# Patient Record
Sex: Male | Born: 1985 | Race: Black or African American | Hispanic: No | Marital: Married | Smoking: Current every day smoker
Health system: Southern US, Community
[De-identification: ages and names within clinical notes are randomized; demographics above are authoritative.]

## PROBLEM LIST (undated history)

## (undated) DIAGNOSIS — J45909 Unspecified asthma, uncomplicated: Secondary | ICD-10-CM

## (undated) DIAGNOSIS — G4730 Sleep apnea, unspecified: Secondary | ICD-10-CM

---

## 2013-01-28 ENCOUNTER — Encounter (HOSPITAL_COMMUNITY): Payer: Self-pay | Admitting: Emergency Medicine

## 2013-01-28 ENCOUNTER — Emergency Department (HOSPITAL_COMMUNITY): Payer: Self-pay

## 2013-01-28 ENCOUNTER — Observation Stay (HOSPITAL_COMMUNITY)
Admission: EM | Admit: 2013-01-28 | Discharge: 2013-01-29 | Disposition: A | Discharge status: Home / Self Care | Payer: MEDICAID | Attending: Internal Medicine | Admitting: Internal Medicine

## 2013-01-28 ENCOUNTER — Observation Stay (HOSPITAL_COMMUNITY): Payer: Self-pay

## 2013-01-28 DIAGNOSIS — E860 Dehydration: Secondary | ICD-10-CM

## 2013-01-28 DIAGNOSIS — G4733 Obstructive sleep apnea (adult) (pediatric): Secondary | ICD-10-CM

## 2013-01-28 DIAGNOSIS — F1210 Cannabis abuse, uncomplicated: Secondary | ICD-10-CM | POA: Insufficient documentation

## 2013-01-28 DIAGNOSIS — N289 Disorder of kidney and ureter, unspecified: Secondary | ICD-10-CM | POA: Diagnosis present

## 2013-01-28 DIAGNOSIS — N179 Acute kidney failure, unspecified: Secondary | ICD-10-CM | POA: Insufficient documentation

## 2013-01-28 DIAGNOSIS — Z720 Tobacco use: Secondary | ICD-10-CM | POA: Diagnosis present

## 2013-01-28 DIAGNOSIS — R55 Syncope and collapse: Secondary | ICD-10-CM

## 2013-01-28 DIAGNOSIS — R61 Generalized hyperhidrosis: Secondary | ICD-10-CM | POA: Insufficient documentation

## 2013-01-28 DIAGNOSIS — J45909 Unspecified asthma, uncomplicated: Secondary | ICD-10-CM | POA: Insufficient documentation

## 2013-01-28 DIAGNOSIS — R5381 Other malaise: Secondary | ICD-10-CM | POA: Insufficient documentation

## 2013-01-28 DIAGNOSIS — D649 Anemia, unspecified: Secondary | ICD-10-CM | POA: Insufficient documentation

## 2013-01-28 DIAGNOSIS — Z9989 Dependence on other enabling machines and devices: Secondary | ICD-10-CM | POA: Diagnosis present

## 2013-01-28 DIAGNOSIS — F1010 Alcohol abuse, uncomplicated: Secondary | ICD-10-CM | POA: Insufficient documentation

## 2013-01-28 DIAGNOSIS — Z87891 Personal history of nicotine dependence: Secondary | ICD-10-CM | POA: Insufficient documentation

## 2013-01-28 DIAGNOSIS — R3989 Other symptoms and signs involving the genitourinary system: Secondary | ICD-10-CM | POA: Insufficient documentation

## 2013-01-28 DIAGNOSIS — R0989 Other specified symptoms and signs involving the circulatory and respiratory systems: Secondary | ICD-10-CM | POA: Insufficient documentation

## 2013-01-28 DIAGNOSIS — R5383 Other fatigue: Secondary | ICD-10-CM | POA: Insufficient documentation

## 2013-01-28 DIAGNOSIS — M6282 Rhabdomyolysis: Secondary | ICD-10-CM | POA: Insufficient documentation

## 2013-01-28 HISTORY — DX: Unspecified asthma, uncomplicated: J45.909

## 2013-01-28 HISTORY — DX: Sleep apnea, unspecified: G47.30

## 2013-01-28 LAB — CBC
HCT: 40.9 % (ref 39.0–52.0)
MCH: 27.4 pg (ref 26.0–34.0)
MCV: 81.2 fL (ref 78.0–100.0)
RBC: 5.04 MIL/uL (ref 4.22–5.81)
WBC: 7.5 10*3/uL (ref 4.0–10.5)

## 2013-01-28 LAB — COMPREHENSIVE METABOLIC PANEL
Albumin: 3.7 g/dL (ref 3.5–5.2)
Alkaline Phosphatase: 40 U/L (ref 39–117)
BUN: 11 mg/dL (ref 6–23)
CO2: 24 mEq/L (ref 19–32)
Chloride: 105 mEq/L (ref 96–112)
Creatinine, Ser: 1.57 mg/dL — ABNORMAL HIGH (ref 0.50–1.35)
GFR calc Af Amer: 67 mL/min — ABNORMAL LOW (ref >90)
GFR calc non Af Amer: 58 mL/min — ABNORMAL LOW (ref >90)
Glucose, Bld: 135 mg/dL — ABNORMAL HIGH (ref 70–99)
Potassium: 3.5 mEq/L (ref 3.5–5.1)
Total Bilirubin: 0.2 mg/dL — ABNORMAL LOW (ref 0.3–1.2)

## 2013-01-28 LAB — BASIC METABOLIC PANEL
CO2: 22 mEq/L (ref 19–32)
Calcium: 8.8 mg/dL (ref 8.4–10.5)
Chloride: 105 mEq/L (ref 96–112)
Creatinine, Ser: 1.16 mg/dL (ref 0.50–1.35)
Glucose, Bld: 116 mg/dL — ABNORMAL HIGH (ref 70–99)

## 2013-01-28 LAB — CK TOTAL AND CKMB (NOT AT ARMC)
CK, MB: 4.8 ng/mL — ABNORMAL HIGH (ref 0.3–4.0)
Total CK: 817 U/L — ABNORMAL HIGH (ref 7–232)

## 2013-01-28 LAB — RAPID URINE DRUG SCREEN, HOSP PERFORMED
Amphetamines: NOT DETECTED
Opiates: NOT DETECTED

## 2013-01-28 LAB — URINALYSIS, ROUTINE W REFLEX MICROSCOPIC
Glucose, UA: NEGATIVE mg/dL
Ketones, ur: NEGATIVE mg/dL
Leukocytes, UA: NEGATIVE
Protein, ur: NEGATIVE mg/dL
Urobilinogen, UA: 0.2 mg/dL (ref 0.0–1.0)

## 2013-01-28 LAB — URINE MICROSCOPIC-ADD ON

## 2013-01-28 LAB — POCT I-STAT, CHEM 8
Chloride: 106 mEq/L (ref 96–112)
Creatinine, Ser: 1.6 mg/dL — ABNORMAL HIGH (ref 0.50–1.35)
HCT: 45 % (ref 39.0–52.0)
Hemoglobin: 15.3 g/dL (ref 13.0–17.0)
Potassium: 3.3 mEq/L — ABNORMAL LOW (ref 3.5–5.1)
Sodium: 144 mEq/L (ref 135–145)

## 2013-01-28 LAB — POCT I-STAT TROPONIN I

## 2013-01-28 MED ORDER — SODIUM CHLORIDE 0.9 % IV SOLN
INTRAVENOUS | Status: DC
  Administered 2013-01-28 – 2013-01-29 (×2): via INTRAVENOUS

## 2013-01-28 MED ORDER — POTASSIUM CHLORIDE CRYS ER 20 MEQ PO TBCR
40.0000 meq | EXTENDED_RELEASE_TABLET | Freq: Once | ORAL | Status: AC
  Administered 2013-01-28: 40 meq via ORAL
  Filled 2013-01-28: qty 2

## 2013-01-28 MED ORDER — SODIUM CHLORIDE 0.9 % IV SOLN
INTRAVENOUS | Status: AC
  Administered 2013-01-28: 150 mL/h via INTRAVENOUS

## 2013-01-28 MED ORDER — SODIUM CHLORIDE 0.9 % IV BOLUS (SEPSIS)
1000.0000 mL | Freq: Once | INTRAVENOUS | Status: AC
  Administered 2013-01-28: 1000 mL via INTRAVENOUS

## 2013-01-28 NOTE — ED Notes (Signed)
Pt admits to smoking marijuana tonight

## 2013-01-28 NOTE — ED Provider Notes (Signed)
History    CSN: [id number] Arrival date & time 01/28/13  0302  First MD Initiated Contact with Patient 01/28/13 [phone]     Chief Complaint  Patient presents with  . Loss of Consciousness  . Alcohol Intoxication   (Consider location/radiation/quality/duration/timing/severity/associated sxs/prior Treatment) HPI History provided by the patient.  Last ate around noon, spent the day outside in the heat.  This evening went out with friends, consumed 3-4 alcoholic beverages and smoked marijuana.  He was feeling well until he got home, became diaphoretic, c/o near syncope without LOC. No CP or SOB. He drinks alcohol about once a month. He denies any other illicit drugs. No other complaints. Now in the ER has generalized weakness. Symptoms mod in severity  Past Medical History  Diagnosis Date  . Sleep apnea   . Asthma    History reviewed. No pertinent past surgical history. No family history on file. History  Substance Use Topics  . Smoking status: Current Some Day Smoker    Types: Cigars  . Smokeless tobacco: Never Used  . Alcohol Use: Yes     Comment: occasionally    Review of Systems  Constitutional: Negative for fever and chills.  HENT: Negative for neck pain and neck stiffness.   Eyes: Negative for pain.  Respiratory: Negative for shortness of breath.   Cardiovascular: Negative for chest pain.  Gastrointestinal: Negative for abdominal pain.  Genitourinary: Negative for dysuria.  Musculoskeletal: Negative for gait problem.  Skin: Negative for rash.  Neurological: Positive for weakness. Negative for seizures and speech difficulty.  All other systems reviewed and are negative.    Allergies  Review of patient's allergies indicates no known allergies.  Home Medications  No current outpatient prescriptions on file. BP 95/58  Pulse 98  Temp(Src) 97.6 F (36.4 C) (Oral)  Resp 17  SpO2 96% Physical Exam  Constitutional: He is oriented to person, place, and time. He  appears well-developed and well-nourished.  HENT:  Head: Normocephalic and atraumatic.  Dry mm  Eyes: EOM are normal. Pupils are equal, round, and reactive to light.  Neck: Neck supple.  Cardiovascular: Regular rhythm and intact distal pulses.   Borderline tachy HR 90-110  Pulmonary/Chest: Effort normal and breath sounds normal. No respiratory distress. He exhibits no tenderness.  Abdominal: Soft. Bowel sounds are normal. He exhibits no distension. There is no tenderness.  Musculoskeletal: Normal range of motion. He exhibits no edema and no tenderness.  Neurological: He is alert and oriented to person, place, and time.  Skin: Skin is warm and dry.    ED Course  Procedures (including critical care time)  Results for orders placed during the hospital encounter of 01/28/13  GLUCOSE, CAPILLARY      Result Value Range   Glucose-Capillary 147 (*) 70 - 99 mg/dL  CBC      Result Value Range   WBC 7.5  4.0 - 10.5 K/uL   RBC 5.04  4.22 - 5.81 MIL/uL   Hemoglobin 13.8  13.0 - 17.0 g/dL   HCT 56.2  13.0 - 86.5 %   MCV 81.2  78.0 - 100.0 fL   MCH 27.4  26.0 - 34.0 pg   MCHC 33.7  30.0 - 36.0 g/dL   RDW 78.4  69.6 - 29.5 %   Platelets 303  150 - 400 K/uL  ETHANOL      Result Value Range   Alcohol, Ethyl (B) 13 (*) 0 - 11 mg/dL  COMPREHENSIVE METABOLIC PANEL  Result Value Range   Sodium 142  135 - 145 mEq/L   Potassium 3.5  3.5 - 5.1 mEq/L   Chloride 105  96 - 112 mEq/L   CO2 24  19 - 32 mEq/L   Glucose, Bld 135 (*) 70 - 99 mg/dL   BUN 11  6 - 23 mg/dL   Creatinine, Ser 1.61 (*) 0.50 - 1.35 mg/dL   Calcium 9.2  8.4 - 09.6 mg/dL   Total Protein 7.6  6.0 - 8.3 g/dL   Albumin 3.7  3.5 - 5.2 g/dL   AST 33  0 - 37 U/L   ALT 47  0 - 53 U/L   Alkaline Phosphatase 40  39 - 117 U/L   Total Bilirubin 0.2 (*) 0.3 - 1.2 mg/dL   GFR calc non Af Amer 58 (*) >90 mL/min   GFR calc Af Amer 67 (*) >90 mL/min  CK TOTAL AND CKMB      Result Value Range   Total CK 817 (*) 7 - 232 U/L   CK,  MB 4.8 (*) 0.3 - 4.0 ng/mL   Relative Index 0.6  0.0 - 2.5  POCT I-STAT, CHEM 8      Result Value Range   Sodium 144  135 - 145 mEq/L   Potassium 3.3 (*) 3.5 - 5.1 mEq/L   Chloride 106  96 - 112 mEq/L   BUN 11  6 - 23 mg/dL   Creatinine, Ser 0.45 (*) 0.50 - 1.35 mg/dL   Glucose, Bld 409 (*) 70 - 99 mg/dL   Calcium, Ion 8.11  1.12 - 1.23 mmol/L   TCO2 22  0 - 100 mmol/L   Hemoglobin 15.3  13.0 - 17.0 g/dL   HCT 91.4  78.2 - 95.6 %  POCT I-STAT TROPONIN I      Result Value Range   Troponin i, poc 0.01  0.00 - 0.08 ng/mL   Comment 3            Dg Chest Portable 1 View  01/28/2013   *RADIOLOGY REPORT*  Clinical Data: Loss of consciousness  PORTABLE CHEST - 1 VIEW  Comparison: None.  Findings: The cardiac and mediastinal silhouettes are accentuated, due to hypoinflation and AP technique.  The lungs are hypoinflated.  There is diffuse pulmonary vascular congestion without frank pulmonary edema.  No pleural effusion. Bibasilar opacities most likely reflect atelectasis and / or crowding of the normal bronchovascular markings. Possible superimposed is not excluded.  The bony thorax is intact.  IMPRESSION: 1.  Hypoinflation with diffuse pulmonary vascular congestion.  2.  Bibasilar opacities may represent atelectasis and / or crowding of the normal bronchovascular markings due to the shallow lung inflation, however, a superimposed aspiration or infectious pneumonitis could have this appearance as well.   Original Report Authenticated By: Rise Mu, M.D.     Date: 01/28/2013  Rate: 97  Rhythm: normal sinus rhythm  QRS Axis: normal  Intervals: normal  ST/T Wave abnormalities: ST elevations inferiorly and ST elevations laterally  Conduction Disutrbances:none  Narrative Interpretation: NSR with ST elevations and no reciprocal changes  Old EKG Reviewed: none available  3:57 AM EKG reviewed CAR on call, DR Ronney Asters feels this is early repol and not c/w ACS  IVFs, cardiac  monitoring  5:10 AM d/w MED, DR Julian Reil, will admit MDM  Near syncope/ dehydration in the setting of alcohol and outside activities in the heat.  Abnormal ECG  IVFs  Labs and imaging reviewed, elevated crt unk baseline.  MED consult for admit  Sunnie Nielsen, MD 01/28/13 [phone]

## 2013-01-28 NOTE — ED Notes (Signed)
Pt states that he has been congested and not feeling well all week. Pt c/o HA all day. Pt went out with friends for a few alcohol drinks when he began feeling like he was SOB. Pt experienced a witnessed LOC. Brought to ED by his wife. Pt is lethargic but oriented.

## 2013-01-28 NOTE — ED Notes (Signed)
Spoke to Dr. Dierdre Highman, stated that cardiology stated they believe the elevated ST is related to early repolarization. There is not a need for further telemetry monitoring.

## 2013-01-28 NOTE — Progress Notes (Signed)
[facility] PROGRESS NOTE  [name] ZOX:[id number] DOB: October 02, 1985 DOA: 01/28/2013 PCP: No primary provider on file.  Assessment/Plan: Principal Problem:   Near syncope Active Problems:   Renal insufficiency    1. Near syncope: Patient presented following a presyncopal episode, characterized by dizziness and diaphoresis, secondary to dehydration in setting of ETOH intake and activities outside in heat. ETOH levekl was 13. No arrhythmias have been recorded on telemetric monitoring, and cardiac enzymes are unelevated. 12-lead KKG demonstrated concave upwards ST elevation infero-laterally, but patient had no chest pain. Dr Mahala Menghini discussed these findings with Dr Chase Picket, cardiologist, who favors early repolarization. Will check 2D Echocardiogram and ESR, for completeness.  2. Dehydration/Acute Renal Injury: Creatinine was 1.60 at presentation. This likely pre-renal, secondary to dehydration and volume depletion, as well as mild rhabdomyolysis. Unfortunately, baseline creatinine is unavailable/unknown at thiis time. Managing with iv fluids, and following renal indices. Renal U/S showed no evidence of Hydronephrosis. Following renal indices.  3. Rhabdomyolysis: CK at presentation, was 817, consistent with mild rhabdomyolysis. This is likely contributory to AKI. Managing with iv fluids/Following CK. UDS is positive only for THC.  4. Renal lesion: Renal U/s revealed question exophytic cystic lesion at inferior pole of the right kidney versus artifact secondary to poor acoustic window and  question shadowing from bowel. Consider follow-up noncontrast CT abdomen to exclude mass lesion at inferior pole right kidney. 5. Bronchial Asthma: Mild, asymptomatic/quiescent. 6. OSA: Known history of OSA, but non-compliant with CPAP. Will reinstate, during hospitalization.  7. Tobacco Abuse: Patient smokes Cigars. Counseled.      Code Status: Full Code.  Family Communication:  Disposition Plan: To  be determined.    Brief narrative: 27 y.o. male with history of tobacco abuse, OSA, bronchial asthma, who presents to the ED after a near syncopal episode and in general not feeling very well. The patient last ate around noon on 01/27/13, and spent the day outside in the heat. In the evening went out with friends, consumed 3-4 alcoholic beverages and smoked marijuana. He was feeling well until he got home, when he became diaphoretic, had near syncope, but did not have true LOC. No chest pain nor SOB. He drinks alcohol about once a month. He denies any other illicit drugs. No other complaints. In the ER had generalized weakness. Symptoms moderate in severity. Work up in the ED was remarkable for a creatinine of 1.6 (unknown baseline), BUN of 11, EKG suggestive of early repolarization (confirmed with cardiology on phone). Admitted for further management.   Consultants:  N/A.   Procedures:  CXR.  Renal U/S.   Antibiotics:  N/A.   HPI/Subjective: Feels better.   Objective: Vital signs in last 24 hours: Temp:  [97.6 F (36.4 C)-97.9 F (36.6 C)] 97.9 F (36.6 C) (07/24 0610) Pulse Rate:  [96-104] 104 (07/24 0610) Resp:  [15-24] 20 (07/24 0610) BP: (91-122)/(46-71) 105/71 mmHg (07/24 0610) SpO2:  [93 %-96 %] 96 % (07/24 0610) Weight:  [146.3 kg (322 lb 8.5 oz)] 146.3 kg (322 lb 8.5 oz) (07/24 0610) Weight change:  Last BM Date: 01/27/13  Intake/Output from previous day:       Physical Exam: General: Comfortable, alert, communicative, fully oriented, not short of breath at rest.  HEENT:  No clinical pallor, no jaundice, no conjunctival injection or discharge. Hydration is fair.  NECK:  Supple, JVP not seen, no carotid bruits, no palpable lymphadenopathy, no palpable goiter. CHEST:  Clinically clear to auscultation, no wheezes, no crackles. HEART:  Sounds 1 and  2 heard, normal, regular, no murmurs. ABDOMEN:  Morbidly obese, soft, non-tender, no palpable organomegaly, no  palpable masses, normal bowel sounds. GENITALIA:  Not examined. LOWER EXTREMITIES:  No pitting edema, palpable peripheral pulses. MUSCULOSKELETAL SYSTEM:  Unremarkable. CENTRAL NERVOUS SYSTEM:  No focal neurologic deficit on gross examination.  Lab Results:  Recent Labs  01/28/13 0336 01/28/13 0359  WBC 7.5  --   HGB 13.8 15.3  HCT 40.9 45.0  PLT 303  --     Recent Labs  01/28/13 0336 01/28/13 0359  NA 142 144  K 3.5 3.3*  CL 105 106  CO2 24  --   GLUCOSE 135* 136*  BUN 11 11  CREATININE 1.57* 1.60*  CALCIUM 9.2  --    No results found for this or any previous visit (from the past 240 hour(s)).   Studies/Results: Dg Chest Portable 1 View  01/28/2013   *RADIOLOGY REPORT*  Clinical Data: Loss of consciousness  PORTABLE CHEST - 1 VIEW  Comparison: None.  Findings: The cardiac and mediastinal silhouettes are accentuated, due to hypoinflation and AP technique.  The lungs are hypoinflated.  There is diffuse pulmonary vascular congestion without frank pulmonary edema.  No pleural effusion. Bibasilar opacities most likely reflect atelectasis and / or crowding of the normal bronchovascular markings. Possible superimposed is not excluded.  The bony thorax is intact.  IMPRESSION: 1.  Hypoinflation with diffuse pulmonary vascular congestion.  2.  Bibasilar opacities may represent atelectasis and / or crowding of the normal bronchovascular markings due to the shallow lung inflation, however, a superimposed aspiration or infectious pneumonitis could have this appearance as well.   Original Report Authenticated By: Rise Mu, M.D.    Medications: Scheduled Meds: . sodium chloride   Intravenous STAT   Continuous Infusions: . sodium chloride     PRN Meds:.    LOS: 0 days   Leocadia Idleman,CHRISTOPHER  [facility] Pager [phone]. If 8PM-8AM, please contact night-coverage at [email or web address], password [facility] 01/28/2013, 7:29 AM  LOS: 0 days

## 2013-01-28 NOTE — ED Notes (Addendum)
EDP, Dierdre Highman, MD. Made aware of results I-stat troponin 0.01. Pt. i-stat Chem 8 results potassium 3.3.

## 2013-01-28 NOTE — H&P (Signed)
[facility] History and Physical  [name] ZOX:[id number] DOB: February 18, 1986 DOA: 01/28/2013  Referring physician: ED PCP: No primary provider on file.   Chief Complaint: Pre-syncope  HPI: [name] is a 27 y.o. male who presents to the ED after a near syncopal episode and in general not feeling very well.  The patient last ate around noon, and spent the day outside in the heat.  This evening went out with friends, he consumed 3-4 alcoholic beverages and smoked marijuana.  He was feeling well until he got home, when he became diaphoretic, c/o near syncope but did not have true LOC. No CP nor SOB. He drinks alcohol about once a month. He denies any other illicit drugs. No other complaints. Now in the ER has generalized weakness. Symptoms mod in severity.  Work up in the ED was remarkable for a creatinine of 1.6 (unknown baseline), BUN of 11, EKG suggestive of early repolarization (confirmed with cardiology on phone).  Hospitalist has been asked to admit for observation.   Review of Systems: 12 systems reviewed and otherwise negative.  Past Medical History  Diagnosis Date  . Sleep apnea   . Asthma    History reviewed. No pertinent past surgical history. Social History:  reports that he has been smoking Cigars.  He has never used smokeless tobacco. He reports that  drinks alcohol. He reports that he uses illicit drugs (Marijuana).   No Known Allergies  No family history on file.  Prior to Admission medications   Not on File   Physical Exam: Filed Vitals:   01/28/13 0415 01/28/13 0430 01/28/13 0445 01/28/13 0500  BP: 122/70 111/67 91/61 100/61  Pulse: 100 101 99 99  Temp:      TempSrc:      Resp: 21 16 15 15   SpO2: 95% 94% 93% 95%    General:  NAD, resting comfortably in bed Eyes: PEERLA EOMI ENT: mucous membranes moist Neck: supple w/o JVD Cardiovascular: RRR w/o MRG Respiratory: CTA B Abdomen: soft, nt, nd, bs+ Skin: no rash nor lesion Musculoskeletal:  MAE, full ROM all 4 extremities Psychiatric: normal tone and affect Neurologic: AAOx3, grossly non-focal  Labs on Admission:  Basic Metabolic Panel:  Recent Labs Lab 01/28/13 0336 01/28/13 0359  NA 142 144  K 3.5 3.3*  CL 105 106  CO2 24  --   GLUCOSE 135* 136*  BUN 11 11  CREATININE 1.57* 1.60*  CALCIUM 9.2  --    Liver Function Tests:  Recent Labs Lab 01/28/13 0336  AST 33  ALT 47  ALKPHOS 40  BILITOT 0.2*  PROT 7.6  ALBUMIN 3.7   No results found for this basename: LIPASE, AMYLASE,  in the last 168 hours No results found for this basename: AMMONIA,  in the last 168 hours CBC:  Recent Labs Lab 01/28/13 0336 01/28/13 0359  WBC 7.5  --   HGB 13.8 15.3  HCT 40.9 45.0  MCV 81.2  --   PLT 303  --    Cardiac Enzymes:  Recent Labs Lab 01/28/13 0334  CKTOTAL 817*  CKMB 4.8*    BNP (last 3 results) No results found for this basename: PROBNP,  in the last 8760 hours CBG:  Recent Labs Lab 01/28/13 0312  GLUCAP 147*    Radiological Exams on Admission: Dg Chest Portable 1 View  01/28/2013   *RADIOLOGY REPORT*  Clinical Data: Loss of consciousness  PORTABLE CHEST - 1 VIEW  Comparison: None.  Findings: The cardiac and mediastinal silhouettes  are accentuated, due to hypoinflation and AP technique.  The lungs are hypoinflated.  There is diffuse pulmonary vascular congestion without frank pulmonary edema.  No pleural effusion. Bibasilar opacities most likely reflect atelectasis and / or crowding of the normal bronchovascular markings. Possible superimposed is not excluded.  The bony thorax is intact.  IMPRESSION: 1.  Hypoinflation with diffuse pulmonary vascular congestion.  2.  Bibasilar opacities may represent atelectasis and / or crowding of the normal bronchovascular markings due to the shallow lung inflation, however, a superimposed aspiration or infectious pneumonitis could have this appearance as well.   Original Report Authenticated By: Rise Mu,  M.D.    EKG: Independently reviewed. ST elevations in inferior and lateral leads felt to be secondary to early repolarization and not ACS.  This confirmed with Dr. Ronney Asters.  Assessment/Plan Principal Problem:   Near syncope Active Problems:   Renal insufficiency   1. Near syncope - secondary to dehydration in setting of EtOH intake and activities outside in heat, has early repol on EKG (confirmed with Dr. Ronney Asters) and no cardiac symptoms / negative troponin.  Admitting for observation, hydration with IVF, repeat labs ordered for tomorrow AM. 2. Renal insufficiency - possibly AKI secondary to dehydration but his BUN:Cr ratio is unusually low for this, while we are hydrating the patient and will monitor Is and Os, I am suspicious that this may actually represent an undiagnosed CKD.  If he does have CKD at age 25 then likely will want to follow up with nephrology regarding the as yet undiagnosed cause of this.  Have also ordered renal ultrasound.    Code Status: Full Code (must indicate code status--if unknown or must be presumed, indicate so) Family Communication: Spoke with wife at bedside (indicate person spoken with, if applicable, with phone number if by telephone) Disposition Plan: Admit to obs (indicate anticipated LOS)  Time spent: 50 min  GARDNER, JARED M. [facility] Pager [phone]  If 7PM-7AM, please contact night-coverage [email or web address] Password [facility] 01/28/2013, 5:39 AM

## 2013-01-28 NOTE — Progress Notes (Signed)
Echocardiogram 2D Echocardiogram has been performed.  Race Latour 01/28/2013, 12:02 PM

## 2013-01-28 NOTE — ED Notes (Signed)
Dr. Julian Reil in with pt and family at this time.

## 2013-01-29 ENCOUNTER — Encounter (HOSPITAL_COMMUNITY): Payer: Self-pay | Admitting: *Deleted

## 2013-01-29 DIAGNOSIS — N179 Acute kidney failure, unspecified: Secondary | ICD-10-CM | POA: Diagnosis present

## 2013-01-29 DIAGNOSIS — E860 Dehydration: Secondary | ICD-10-CM | POA: Diagnosis present

## 2013-01-29 LAB — CBC
HCT: 37.8 % — ABNORMAL LOW (ref 39.0–52.0)
Hemoglobin: 12.1 g/dL — ABNORMAL LOW (ref 13.0–17.0)
RDW: 14.1 % (ref 11.5–15.5)
WBC: 6.4 10*3/uL (ref 4.0–10.5)

## 2013-01-29 LAB — BASIC METABOLIC PANEL
BUN: 10 mg/dL (ref 6–23)
Chloride: 108 mEq/L (ref 96–112)
GFR calc Af Amer: >90 mL/min (ref >90)
GFR calc non Af Amer: >90 mL/min (ref >90)
Glucose, Bld: 103 mg/dL — ABNORMAL HIGH (ref 70–99)
Potassium: 4.2 mEq/L (ref 3.5–5.1)
Sodium: 139 mEq/L (ref 135–145)

## 2013-01-29 NOTE — Discharge Summary (Signed)
Physician Discharge Summary  [name] ZOX:[id number] DOB: 1985/12/22 DOA: 01/28/2013  PCP: No primary provider on file.  Admit date: 01/28/2013 Discharge date: 01/29/2013  Time spent: Less than 30 minutes  Recommendations for Outpatient Follow-up:  1. PCP @ [facility] on 02/04/2013. 2. May consider repeating total CK during PCP visit on 02/04/13. 3. Recommend OP Non contrast CT Abdomen to evaluate ? Right renal lesion seen on US Abdomen. 4. Continue nightly CPAP at bedtime. 5. Counseled moderation/abstinence from Alcohol, THC & Tobacco cessation.  Discharge Diagnoses:  Principal Problem:   Near syncope Active Problems:   Renal insufficiency   Rhabdomyolysis   Tobacco abuse   OSA on CPAP   Discharge Condition: Improved & Stable  Diet recommendation: Heart Healthy diet  Filed Weights   01/28/13 0610  Weight: 146.3 kg (322 lb 8.5 oz)    History of present illness:  27 y.o. male with history of tobacco & THC abuse, OSA on CPAP, bronchial asthma, who presented to the ED after a near syncopal episode and generally not feeling very well. The patient last ate around noon on 01/27/13 and spent the day outside in the heat. In the evening went out with friends, consumed 3-4 alcoholic beverages and smoked marijuana. He was feeling well until he got home, when he became diaphoretic and had near syncope, but did not have true LOC. No chest pain nor SOB. He drinks alcohol about once a month. He denies any other illicit drugs. No other complaints. In the ER he had generalized weakness. Work up in the ED was remarkable for a creatinine of 1.6 (unknown baseline), BUN of 11, EKG suggestive of early repolarization (confirmed with cardiology on phone). Admitted for further management.  Hospital Course:  1. Near syncope: Likely secondary to alcohol intoxication and dehydration in the setting of alcohol use, poor oral intake and heat exposure. Alcohol level on  admission: 13. No arrhythmias on telemetry and troponin was negative. EKG demonstrated some changes which were discussed with the cardiologist on call who favored these findings secondary to repolarization. 2-D echo was a technically difficult study & results are as below and unremarkable. Patient has been ambulating to the bathroom and denies any complaints. He denies dizziness, lightheadedness, sensations of passing out, chest pain, dyspnea or palpitations.  2. Dehydration: Resolved after IV fluids. Patient encouraged to drink fluids ad lib. 3. Acute renal failure/prerenal azotemia: Secondary to dehydration and mild rhabdomyolysis. Baseline creatinine is not known. Creatinine has normalized after IV fluid hydration. Renal ultrasound does not show hydronephrosis. 4. Mild rhabdomyolysis: CK at presentation was 817. Patient asymptomatic. Managed with IV fluids. Encouraged ad lib. oral liquids and may consider repeating CK during PCP visit in approximately a week's time. 5. Bronchial asthma: Stable. 6. OSA: Patient not fully compliant with CPAP at home. Counseled regarding compliance with CPAP and he verbalized understanding. 7. Tobacco, THC and alcohol abuse/binge: Patient states that he drinks alcohol only once a month but had too many drinks the last time. Counseled regarding cessation of tobacco & THC. Also counseled regarding moderation or abstinence from alcohol. 8. Right renal lesion: A renal ultrasound reports? Exophytic cystic lesion at the inferior pole of right kidney versus artifact. Patient denies hematuria or dysuria. Recommend OP non-contrasted CT abdomen to exclude mass lesion at the inferior pole of right kidney.  9. Mild anemia:? Dilutional. OP follow up as deemed necessary.   Procedures:  None   Consultations:  None  Discharge Exam:  Complaints:  Patient denies complaints. Anxious to go home.   Filed Vitals:   01/28/13 0610 01/28/13 1533 01/28/13 2110 01/29/13 0646  BP:  105/71 119/76 110/57 111/59  Pulse: 104 106 98 86  Temp: 97.9 F (36.6 C) 98.2 F (36.8 C) 98 F (36.7 C) 97.7 F (36.5 C)  TempSrc: Oral Oral Oral Oral  Resp: 20 20 18 16   Height: 5\' 10"  (1.778 m)     Weight: 146.3 kg (322 lb 8.5 oz)     SpO2: 96% 95% 100% 99%     General exam: Comfortable. Obese.   Respiratory system: Clear. No increased work of breathing.  Cardiovascular system: S1 and S2 heard, RRR. No JVD, murmurs or pedal edema.  Gastrointestinal system: Abdomen is nondistended, soft and nontender. Normal bowel sounds heard.  Central nervous system: Alert and oriented. No focal neurological deficits.  Extremities: Symmetric 5 x 5 power.  Discharge Instructions      Discharge Orders   Future Appointments Provider Department Dept Phone   02/04/2013 3:30 PM [facility] Covering Provider [facility] Joan Flores [phone]   Future Orders Complete By Expires     Activity as tolerated - No restrictions  As directed     Call MD for:  extreme fatigue  As directed     Call MD for:  persistant dizziness or light-headedness  As directed     Diet - low sodium heart healthy  As directed     Discharge instructions  As directed     Comments:      Continue to use nightly CPAP at bedtime.        Medication List    Notice   You have not been prescribed any medications.     Follow-up Information   Follow up On 02/04/2013. (@ 3:30 pm Bring with you discharge paper work form the hospital and names of medications you are on. Will need out patient non contrast CT abdomen to evaluate right renal lesion seen on ultrasound.)    Contact information:   [facility] [address] [place], [place] [address]  Main: [phone]       The results of significant diagnostics from this hospitalization (including imaging, microbiology, ancillary and laboratory) are listed below for reference.    Significant Diagnostic  Studies: US Renal  01/28/2013   *RADIOLOGY REPORT*  Clinical Data: Elevated creatinine  RENAL/URINARY TRACT ULTRASOUND COMPLETE  Comparison:  None  Findings:  Right Kidney:  Approximately 10.7 cm length.  Normal cortical thickness and echogenicity.  No hydronephrosis shadowing calcification.  Lower pole is inadequately visualized due to poor acoustic window and question bowel.  A cine clip suggests potentially an exophytic cystic lesion at the inferior pole though this is inadequately visualized.  Left Kidney:  11.7 cm length.  Normal cortical thickness echogenicity.  No mass, hydronephrosis or shadowing calcification. No perinephric fluid.  Bladder:  Normal appearance  IMPRESSION: Question exophytic cystic lesion at inferior pole of the right kidney versus artifact secondary to poor acoustic window and question shadowing from bowel. Consider follow-up noncontrast CT abdomen to exclude mass lesion at inferior pole right kidney.   Original Report Authenticated By: Ulyses Southward, M.D.   Dg Chest Portable 1 View  01/28/2013   *RADIOLOGY REPORT*  Clinical Data: Loss of consciousness  PORTABLE CHEST - 1 VIEW  Comparison: None.  Findings: The cardiac and mediastinal silhouettes are accentuated, due to hypoinflation and AP technique.  The lungs are hypoinflated.  There is  diffuse pulmonary vascular congestion without frank pulmonary edema.  No pleural effusion. Bibasilar opacities most likely reflect atelectasis and / or crowding of the normal bronchovascular markings. Possible superimposed is not excluded.  The bony thorax is intact.  IMPRESSION: 1.  Hypoinflation with diffuse pulmonary vascular congestion.  2.  Bibasilar opacities may represent atelectasis and / or crowding of the normal bronchovascular markings due to the shallow lung inflation, however, a superimposed aspiration or infectious pneumonitis could have this appearance as well.   Original Report Authenticated By: Rise Mu, M.D.     Microbiology: No results found for this or any previous visit (from the past 240 hour(s)).   Labs: Basic Metabolic Panel:  Recent Labs Lab 01/28/13 0336 01/28/13 0359 01/28/13 0820 01/29/13 0525  NA 142 144 139 139  K 3.5 3.3* 4.3 4.2  CL 105 106 105 108  CO2 24  --  22 25  GLUCOSE 135* 136* 116* 103*  BUN 11 11 10 10   CREATININE 1.57* 1.60* 1.16 1.02  CALCIUM 9.2  --  8.8 8.7   Liver Function Tests:  Recent Labs Lab 01/28/13 0336  AST 33  ALT 47  ALKPHOS 40  BILITOT 0.2*  PROT 7.6  ALBUMIN 3.7   No results found for this basename: LIPASE, AMYLASE,  in the last 168 hours No results found for this basename: AMMONIA,  in the last 168 hours CBC:  Recent Labs Lab 01/28/13 0336 01/28/13 0359 01/29/13 0525  WBC 7.5  --  6.4  HGB 13.8 15.3 12.1*  HCT 40.9 45.0 37.8*  MCV 81.2  --  82.5  PLT 303  --  312   Cardiac Enzymes:  Recent Labs Lab 01/28/13 0334 01/28/13 0820  CKTOTAL 817* 726*  CKMB 4.8*  --    BNP: BNP (last 3 results) No results found for this basename: PROBNP,  in the last 8760 hours CBG:  Recent Labs Lab 01/28/13 0312  GLUCAP 147*    Additional labs:   ESR: 8  UA: Specific gravity 1.028, pH 5.5, trace hemoglobin urine dipstick +, RBCs: 0-2 & hyaline casts. Otherwise negative.    UDS: Positive for THC.  Blood alcohol level on admission: 13  2-D echo 01/28/13 Study Conclusions  - Left ventricle: The cavity size was normal. Wall thickness was normal. Systolic function was normal. The estimated ejection fraction was in the range of 60% to 65%. Although no diagnostic regional wall motion abnormality was identified, this possibility cannot be completely excluded on the basis of this study. Indeterminant diastolic function. - Aortic valve: There was no stenosis. - Mitral valve: No significant regurgitation. - Right ventricle: Poorly visualized. - Right atrium: Poorly visualized. - Tricuspid valve: Poorly visualized. -  Pulmonary arteries: No complete TR doppler jet so unable to estimate PA systolic pressure. - Inferior vena cava: The vessel was normal in size; the respirophasic diameter changes were in the normal range (= 50%); findings are consistent with normal central venous pressure.  Impressions:  - Technically difficult study. The LV was normal in size and systolic function, EF 60-65%. The RV was poorly visualized.     SignedMarcellus Scott  [facility] 01/29/2013, 12:33 PM

## 2013-01-29 NOTE — Progress Notes (Signed)
Prior to 01/29/13 d/c ED Cm provided referral to [facility] liaison for "orange card"  Information.  Liaison saw, spoke with and provided pt with orange card resources to assist with upcoming appointment at [facility] for 7/31 @ 3:30 pm

## 2013-01-29 NOTE — Progress Notes (Signed)
[facility] @ 3:30 pm.  Algernon Huxley RN BSN  [phone]

## 2013-01-29 NOTE — Progress Notes (Signed)
Pt discharged. Reviewed pt discharge instructions and follow up appointment. Pt IVs removed.  Langley Gauss, RN

## 2013-01-29 NOTE — Progress Notes (Signed)
Pt placed on auto CPAP via nasal mask, Pt tolerating well at this time, RT to monitor and assess as needed

## 2013-02-04 ENCOUNTER — Ambulatory Visit: Payer: Self-pay | Attending: Family Medicine | Admitting: Internal Medicine

## 2013-02-04 VITALS — BP 125/84 | HR 91 | Temp 99.0°F | Resp 18 | Ht 71.0 in | Wt 315.0 lb

## 2013-02-04 DIAGNOSIS — D649 Anemia, unspecified: Secondary | ICD-10-CM

## 2013-02-04 DIAGNOSIS — N281 Cyst of kidney, acquired: Secondary | ICD-10-CM

## 2013-02-04 DIAGNOSIS — Q619 Cystic kidney disease, unspecified: Secondary | ICD-10-CM

## 2013-02-04 DIAGNOSIS — M6282 Rhabdomyolysis: Secondary | ICD-10-CM

## 2013-02-04 DIAGNOSIS — Z09 Encounter for follow-up examination after completed treatment for conditions other than malignant neoplasm: Secondary | ICD-10-CM | POA: Insufficient documentation

## 2013-02-04 DIAGNOSIS — E860 Dehydration: Secondary | ICD-10-CM

## 2013-02-04 DIAGNOSIS — N179 Acute kidney failure, unspecified: Secondary | ICD-10-CM

## 2013-02-04 LAB — CBC
HCT: 43.9 % (ref 39.0–52.0)
MCH: 26.7 pg (ref 26.0–34.0)
MCHC: 33.3 g/dL (ref 30.0–36.0)
MCV: 80.4 fL (ref 78.0–100.0)
Platelets: 424 10*3/uL — ABNORMAL HIGH (ref 150–400)
RDW: 14.7 % (ref 11.5–15.5)
WBC: 7.6 10*3/uL (ref 4.0–10.5)

## 2013-02-04 NOTE — Progress Notes (Signed)
Patient states that he is here for hospital follow up due to dehydration.  ED note from 7/24 states renal insuffiencey and dehydration with near syncope.

## 2013-02-04 NOTE — Progress Notes (Signed)
Patient ID: [name], male   DOB: Mar 21, 1986, 27 y.o.   MRN: [id number]  CC: Establish care. Follow up on hospital visit  HPI: [name] is a 27 y/o [place] American man who presents today for Follow-up after hospital overnight admission. He went to the ED because he was dizzy, and couldn't stand up, and had trouble breathing. He had been drinking alcohol on an empty stomach, and smoking marijuana. He was found to be dehydrated with AKI in the ED, and was admitted for and rehydrated overnight. Kidney USS done during admission showed inconclusive report of possible R kidney mass. Upon discharge, recommendations include getting a CK to determine status, and CBC to determine his hemoglobin status. In addition, a non-contrast CT of the abdomen was recommended as further workup for the renal cystic lesion seen on [place].He has no complaints today, he uses CPAP for OSA. Married for 2.5 years, attending a [employer] college. He claims he only drinks socially and not everyday or binge drinker.   No Known Allergies Past Medical History  Diagnosis Date  . Sleep apnea   . Asthma    No current outpatient prescriptions on file prior to visit.   No current facility-administered medications on file prior to visit.   Family History  Problem Relation Age of Onset  . Sarcoidosis Mother   . Hypertension Father    History   Social History  . Marital Status: Married    Spouse Name: N/A    Number of Children: N/A  . Years of Education: N/A   Occupational History  . Not on file.   Social History Main Topics  . Smoking status: Current Some Day Smoker    Types: Cigars  . Smokeless tobacco: Never Used  . Alcohol Use: Yes     Comment: occasionally  . Drug Use: Yes    Special: Marijuana  . Sexually Active: Not on file   Other Topics Concern  . Not on file   Social History Narrative  . No narrative on file    Review of Systems  Constitutional: Negative for fever, chills, diaphoresis, activity  change, appetite change and fatigue.  HENT: Negative for ear pain, nosebleeds, congestion, facial swelling, rhinorrhea, neck pain, neck stiffness and ear discharge.  Eyes: Negative for pain, discharge, redness, itching and visual disturbance. Respiratory: Negative for cough, choking, chest tightness, shortness of breath, wheezing and stridor.  Cardiovascular: Negative for chest pain, palpitations and leg swelling.  Gastrointestinal: Negative for abdominal distention.  Genitourinary: Negative for dysuria, urgency, frequency, hematuria, flank pain, decreased urine volume, difficulty urinating and dyspareunia. Musculoskeletal: Negative for back pain, joint swelling, arthralgias and gait problem. Neurological: Negative for dizziness, tremors, seizures, syncope, facial asymmetry, speech difficulty, weakness, light-headedness, numbness and headaches.  Hematological: Negative for adenopathy. Does not bruise/bleed easily.  Psychiatric/Behavioral: Negative for hallucinations, behavioral problems, confusion, dysphoric mood, decreased concentration and agitation.    Objective:   Filed Vitals:   02/04/13 1602  BP: 125/84  Pulse: 91  Temp: 99 F (37.2 C)  Resp: 18    Physical Exam  Constitutional: Appears well-developed and well-nourished. No distress. Obese HENT: Normocephalic. External right and left ear normal. Oropharynx is clear and moist. Eyes: Conjunctivae and EOM are normal. PERRLA, no scleral icterus. Neck: Normal ROM. Neck supple. No JVD. No tracheal deviation. No thyromegaly. CVS: RRR, S1/S2 +, no murmurs, no gallops, no carotid bruit.  Pulmonary: Effort and breath sounds normal, no stridor, rhonchi, wheezes, rales.  Abdominal: Soft. BS +,  no distension, tenderness,  rebound or guarding. Musculoskeletal: Normal range of motion. No edema and no tenderness. Lymphadenopathy: No lymphadenopathy noted, cervical, inguinal Neuro: Alert. Normal reflexes, muscle tone coordination. No cranial  nerve deficit. Skin: Skin is warm and dry. No rash noted. Not diaphoretic. No erythema. No pallor. Psychiatric: Normal mood and affect. Behavior, judgment, thought content normal. __  Lab Results  Component Value Date   WBC 6.4 01/29/2013   HGB 12.1* 01/29/2013   HCT 37.8* 01/29/2013   MCV 82.5 01/29/2013   PLT 312 01/29/2013   Lab Results  Component Value Date   CREATININE 1.02 01/29/2013   BUN 10 01/29/2013   NA 139 01/29/2013   K 4.2 01/29/2013   CL 108 01/29/2013   CO2 25 01/29/2013    No results found for this basename: HGBA1C   Lipid Panel  No results found for this basename: chol, trig, hdl, cholhdl, vldl, ldlcalc       Assessment and plan:    Follow-up Hospital Admission  Dehydration Resolved  AKI Resolved  ?? Right Kidney Mass  Plan:  CBC  CK  CMP  Non Contrast CT Abdomen to rule out kidney mass  Counseled extensively on smoking cessation  Reduce alcohol consumption  Regular exercise counseling  Dietary advice  [name], MD 02/04/2013

## 2013-02-04 NOTE — Patient Instructions (Addendum)
Substance Abuse Your exam indicates that you have a problem with substance abuse. Substance abuse is the misuse of alcohol or drugs that causes problems in family life, friendships, and work relationships. Substance abuse is the most important cause of premature illness, disability, and death in our society. It is also the greatest threat to a person's mental and spiritual well being. Substance abuse can start out in an innocent way, such as social drinking or taking a little extra medication prescribed by your doctor. No one starts out with the intention of becoming an alcoholic or an addict. Substance abuse victims cannot control their use of alcohol or drugs. They may become intoxicated daily or go on weekend binges. Often there is a strong desire to quit, but attempts to stop using often fail. Encounters with law enforcement or conflicts with family members, friends, and work associates are signs of a potential problem. Recovery is always possible, although the craving for some drugs makes it difficult to quit without assistance. Many treatment programs are available to help people stop abusing alcohol or drugs. The first step in treatment is to admit you have a problem. This is a major hurdle because denial is a powerful force with substance abuse. Alcoholics Anonymous, Narcotics Anonymous, Cocaine Anonymous, and other recovery groups and programs can be very useful in helping people to quit. If you do not feel okay about your drug or alcohol use and if it is causing you trouble, we want to encourage you to talk about it with your doctor or with someone from a recovery group who can help you. You could also call the [employer] on Drug Abuse at 1-800-662-HELP. It is up to you to take the first step. [facility] and A[facility]-TE[facility] are support groups for friends and family members of an alcohol or drug dependent person. The people who love and care for the alcoholic or addicted person often need help, too. For  information about these organizations, check your phone directory or call a local alcohol or drug treatment center. Document Released: 08/01/2004 Document Revised: 09/16/2011 Document Reviewed: 06/25/2008 [facility] Patient Information 2014 [place], [place].  Sleep Apnea  Sleep apnea is a sleep disorder characterized by abnormal pauses in breathing while you sleep. When your breathing pauses, the level of oxygen in your blood decreases. This causes you to move out of deep sleep and into light sleep. As a result, your quality of sleep is poor, and the system that carries your blood throughout your body (cardiovascular system) experiences stress. If sleep apnea remains untreated, the following conditions can develop:  High blood pressure (hypertension).  Coronary artery disease.  Inability to achieve or maintain an erection (impotence).  Impairment of your thought process (cognitive dysfunction). There are three types of sleep apnea: 1. Obstructive sleep apnea Pauses in breathing during sleep because of a blocked airway. 2. Central sleep apnea Pauses in breathing during sleep because the area of the brain that controls your breathing does not send the correct signals to the muscles that control breathing. 3. Mixed sleep apnea A combination of both obstructive and central sleep apnea. RISK FACTORS The following risk factors can increase your risk of developing sleep apnea:  Being overweight.  Smoking.  Having narrow passages in your nose and throat.  Being of older age.  Being male.  Alcohol use.  Sedative and tranquilizer use.  Ethnicity. Among individuals younger than 35 years, African Americans are at increased risk of sleep apnea. SYMPTOMS   Difficulty staying asleep.  Daytime  sleepiness and fatigue.  Loss of energy.  Irritability.  Loud, heavy snoring.  Morning headaches.  Trouble concentrating.  Forgetfulness.  Decreased interest in sex. DIAGNOSIS  In order to  diagnose sleep apnea, your caregiver will perform a physical examination. Your caregiver may suggest that you take a home sleep test. Your caregiver may also recommend that you spend the night in a sleep lab. In the sleep lab, several monitors record information about your heart, lungs, and brain while you sleep. Your leg and arm movements and blood oxygen level are also recorded. TREATMENT The following actions may help to resolve mild sleep apnea:  Sleeping on your side.   Using a decongestant if you have nasal congestion.   Avoiding the use of depressants, including alcohol, sedatives, and narcotics.   Losing weight and modifying your diet if you are overweight. There also are devices and treatments to help open your airway:  Oral appliances. These are custom-made mouthpieces that shift your lower jaw forward and slightly open your bite. This opens your airway.  Devices that create positive airway pressure. This positive pressure "splints" your airway open to help you breathe better during sleep. The following devices create positive airway pressure:  Continuous positive airway pressure (CPAP) device. The CPAP device creates a continuous level of air pressure with an air pump. The air is delivered to your airway through a mask while you sleep. This continuous pressure keeps your airway open.  Nasal expiratory positive airway pressure (EPAP) device. The EPAP device creates positive air pressure as you exhale. The device consists of single-use valves, which are inserted into each nostril and held in place by adhesive. The valves create very little resistance when you inhale but create much more resistance when you exhale. That increased resistance creates the positive airway pressure. This positive pressure while you exhale keeps your airway open, making it easier to breath when you inhale again.  Bilevel positive airway pressure (BPAP) device. The BPAP device is used mainly in patients with  central sleep apnea. This device is similar to the CPAP device because it also uses an air pump to deliver continuous air pressure through a mask. However, with the BPAP machine, the pressure is set at two different levels. The pressure when you exhale is lower than the pressure when you inhale.  Surgery. Typically, surgery is only done if you cannot comply with less invasive treatments or if the less invasive treatments do not improve your condition. Surgery involves removing excess tissue in your airway to create a wider passage way. Document Released: 06/14/2002 Document Revised: 12/24/2011 Document Reviewed: 10/31/2011 [facility] Patient Information 2014 [place], [place]. Kidney Disease, Adult The kidneys are two organs that lie on either side of the spine between the middle of the back and the front of the abdomen. The kidneys:   Remove wastes and extra water from the blood.   Produce important hormones. These regulate blood pressure, help keep bones strong, and help create red blood cells.   Balance the fluids and chemicals in the blood and tissues. Kidney disease occurs when the kidneys are damaged. Kidney damage may be sudden (acute) or develop over a long period (chronic). A small amount of damage may not cause problems, but a large amount of damage may make it difficult or impossible for the kidneys to work the way they should. Early detection and treatment of kidney disease may prevent kidney damage from becoming permanent or getting worse. Some kidney diseases are curable, but most are not.  Many people with kidney disease are able to control the disease and live a normal life.  TYPES OF KIDNEY DISEASE  Acute kidney injury.Acute kidney injury occurs when there is sudden damage to the kidneys.  Chronic kidney disease. Chronic kidney disease occurs when the kidneys are damaged over a long period.  End-stage kidney disease. End-stage kidney disease occurs when the kidneys are so damaged  that they stop working. In end-stage kidney disease, the kidneys cannot get better. CAUSES Any condition, disease, or event that damages the kidneys may cause kidney disease. Acute kidney injury.  A problem with blood flow to the kidneys. This may be caused by:   Blood loss.   Heart disease.   Severe burns.   Liver disease.  Direct damage to the kidneys. This may be caused by:  Some medicines.   A kidney infection.   Poisoning or consuming toxic substances.   A surgical wound.   A blow to the kidney area.   A problem with urine flow. This may be caused by:   Cancer.   Kidney stones.   An enlarged prostate. Chronic kidney disease. The most common causes of chronic kidney disease are diabetes and high blood pressure (hypertension). Chronic kidney disease may also be caused by:   Diseases that cause the filtering units of the kidneys to become inflamed.   Diseases that affect the immune system.   Genetic diseases.   Medicines that damage the kidneys, such as anti-inflammatory medicines.  Poisoning or exposure to toxic substances.   A reoccurring kidney or urinary infection.   A problem with urine flow. This may be caused by:  Cancer.   Kidney stones.   An enlarged prostate in males. End-stage kidney disease. This kidney disease usually occurs when a chronic kidney disease gets worse. It may also occur after acute kidney injury.  SYMPTOMS   Swelling (edema) of the legs, ankles, or feet.   Tiredness (lethargy).   Nausea or vomiting.   Confusion.   Problems with urination, such as:   Painful or burning feeling during urination.   Decreased urine production.  Bloody urine.   Frequent urination, especially at night.  Hypertension.  Muscle twitches and cramps.   Shortness of breath.   Persistent itchiness.   Loss of appetite.  Metallic taste in the mouth.   Weakness.   Seizures.   Chest pain or  pressure.   Trouble sleeping.   Headaches.   Abnormally dark or light skin.   Numbness in the hands or feet.   Easy bruising.   Frequent hiccups.   Menstruation stops. Sometimes, no symptoms are present. DIAGNOSIS  Kidney disease may be detected and diagnosed by tests, including blood, urine, imaging, or kidney biopsy tests.  TREATMENT  Acute kidney injury. Treatment of acute kidney injury varies depending on the cause and severity of the kidney damage. In mild cases, no treatment may be needed. The kidneys may heal on their own. If acute kidney injury is more severe, your caregiver will treat the cause of the kidney damage, help the kidneys heal, and prevent complications from occurring. Severe cases may require a procedure to remove toxic wastes from the body (dialysis) or surgery to repair kidney damage. Surgery may involve:   Repair of a torn kidney.   Removal of an obstruction.  Most of the time, you will need to stay overnight at the hospital.  Chronic kidney disease. Most chronic kidney diseases cannot be cured. Treatment usually involves relieving symptoms and preventing  or slowing the progression of the disease. Treatment may include:   A special diet. You may need to avoid alcohol and foods that:   Have added salt.   Are high in potassium.   Are high in protein.   Medicines. These may:   Lower blood pressure.   Relieve anemia.   Relieve swelling.   Protect the bones.  End-stage kidney disease. End-stage kidney disease is life-threatening and must be treated immediately. There are two treatments for end-stage kidney disease:   Dialysis.   Receiving a new kidney (kidney transplant). Both of these treatments have serious risks and consequences. In addition to having dialysis or a kidney transplant, you may need to take medicines to control hypertension and cholesterol and to decrease phosphorus levels in your blood. LENGTH OF  ILLNESS  Acute kidney injury.The length of this disease varies greatly from person to person. Exactly how long it lasts depends on the cause of the kidney damage. Acute kidney injury may develop into chronic kidney disease or end-stage kidney disease.  Chronic kidney disease. This disease usually lasts a lifetime. Chronic kidney disease may worsen over time to become end-stage kidney disease. The time it takes for end-stage kidney disease to develop varies from person to person.  End-stage kidney disease. This disease lasts until a kidney transplant is performed. PREVENTION  Kidney disease can sometimes be prevented. If you have diabetes, hypertension, or any other condition that may lead to kidney disease, you should try to prevent kidney disease with:   An appropriate diet.  Medicine.  Lifestyle changes. FOR MORE INFORMATION  American Association of Kidney Patients: ResidentialShow.is  National Kidney Foundation: www.kidney.org  American Kidney Fund: FightingMatch.com.ee  Life Options Rehabilitation Program: www.lifeoptions.org and www.kidneyschool.org  Document Released: 06/24/2005 Document Revised: 06/10/2012 Document Reviewed: 02/21/2012 [facility] Patient Information 2014 [place], [place]. Rhabdomyolysis Rhabdomyolysis is the breakdown of muscle fibers due to injury. The injury may come from physical damage to the muscle like an injury but other causes are:  High fever (hyperthermia).  Seizures (convulsions).  Low phosphate levels.  Diseases of metabolism.  Heatstroke.  Drug toxicity.  Over exertion.  Alcoholism.  Muscle is cut off from oxygen (anoxia).  The squeezing of nerves and blood vessels (compartment syndrome). Some drugs which may cause the breakdown of muscle are:  Antibiotics.  Statins.  Alcohol.  Animal toxins. Myoglobin is a substance which helps muscle use oxygen. When the muscle is damaged, the myoglobin is released into the bloodstream. It is filtered out  of the bloodstream by the kidneys. Myoglobin may block up the kidneys. This may cause damage, such as kidney failure. It also breaks down into other damaging toxic parts, which also cause kidney failure.  SYMPTOMS   Dark, red, or tea colored urine.  Weakness of affected muscles.  Weight gain from water retention.  Joint aches and pains.  Irregular heart from high potassium in the blood.  Muscle tenderness or aching.  Generalized weakness.  Seizures.  Feeling tired (fatigue). DIAGNOSIS  Your caregiver may find muscle tenderness on exam and suspect the problem. Urine tests and blood work can confirm the problem. TREATMENT   Early and aggressive treatment with large amounts of fluids may help prevent kidney failure.  Water producing medicine (diuretic) may be used to help flush the kidneys.  High potassium and calcium problems (electrolyte) in your blood may need treatment. HOME CARE INSTRUCTIONS  This problem is usually cared for in a hospital. If you are allowed to go home and require  dialysis, make sure you keep all appointments for lab work and dialysis. Not doing so could result in death. Document Released: June 22, 2004 Document Revised: 09/16/2011 Document Reviewed: 12/19/2008 [facility] Patient Information 2014 [place], [place].  Smoking Cessation, Tips for Success YOU CAN QUIT SMOKING If you are ready to quit smoking, congratulations! You have chosen to help yourself be healthier. Cigarettes bring nicotine, tar, carbon monoxide, and other irritants into your body. Your lungs, heart, and blood vessels will be able to work better without these poisons. There are many different ways to quit smoking. Nicotine gum, nicotine patches, a nicotine inhaler, or nicotine nasal spray can help with physical craving. Hypnosis, support groups, and medicines help break the habit of smoking. Here are some tips to help you quit for good.  Throw away all cigarettes.  Clean and remove all  ashtrays from your home, work, and car.  On a card, write down your reasons for quitting. Carry the card with you and read it when you get the urge to smoke.  Cleanse your body of nicotine. Drink enough water and fluids to keep your urine clear or pale yellow. Do this after quitting to flush the nicotine from your body.  Learn to predict your moods. Do not let a bad situation be your excuse to have a cigarette. Some situations in your life might tempt you into wanting a cigarette.  Never have "just one" cigarette. It leads to wanting another and another. Remind yourself of your decision to quit.  Change habits associated with smoking. If you smoked while driving or when feeling stressed, try other activities to replace smoking. Stand up when drinking your coffee. Brush your teeth after eating. Sit in a different chair when you read the paper. Avoid alcohol while trying to quit, and try to drink fewer caffeinated beverages. Alcohol and caffeine may urge you to smoke.  Avoid foods and drinks that can trigger a desire to smoke, such as sugary or spicy foods and alcohol.  Ask people who smoke not to smoke around you.  Have something planned to do right after eating or having a cup of coffee. Take a walk or exercise to perk you up. This will help to keep you from overeating.  Try a relaxation exercise to calm you down and decrease your stress. Remember, you may be tense and nervous for the first 2 weeks after you quit, but this will pass.  Find new activities to keep your hands busy. Play with a pen, coin, or rubber band. Doodle or draw things on paper.  Brush your teeth right after eating. This will help cut down on the craving for the taste of tobacco after meals. You can try mouthwash, too.  Use oral substitutes, such as lemon drops, carrots, a cinnamon stick, or chewing gum, in place of cigarettes. Keep them handy so they are available when you have the urge to smoke.  When you have the urge  to smoke, try deep breathing.  Designate your home as a nonsmoking area.  If you are a heavy smoker, ask your caregiver about a prescription for nicotine chewing gum. It can ease your withdrawal from nicotine.  Reward yourself. Set aside the cigarette money you save and buy yourself something nice.  Look for support from others. Join a support group or smoking cessation program. Ask someone at home or at work to help you with your plan to quit smoking.  Always ask yourself, "Do I need this cigarette or is this just a reflex?"  Tell yourself, "Today, I choose not to smoke," or "I do not want to smoke." You are reminding yourself of your decision to quit, even if you do smoke a cigarette. HOW WILL I FEEL WHEN I QUIT SMOKING?  The benefits of not smoking start within days of quitting.  You may have symptoms of withdrawal because your body is used to nicotine (the addictive substance in cigarettes). You may crave cigarettes, be irritable, feel very hungry, cough often, get headaches, or have difficulty concentrating.  The withdrawal symptoms are only temporary. They are strongest when you first quit but will go away within 10 to 14 days.  When withdrawal symptoms occur, stay in control. Think about your reasons for quitting. Remind yourself that these are signs that your body is healing and getting used to being without cigarettes.  Remember that withdrawal symptoms are easier to treat than the major diseases that smoking can cause.  Even after the withdrawal is over, expect periodic urges to smoke. However, these cravings are generally short-lived and will go away whether you smoke or not. Do not smoke!  If you relapse and smoke again, do not lose hope. Most smokers quit 3 times before they are successful.  If you relapse, do not give up! Plan ahead and think about what you will do the next time you get the urge to smoke. LIFE AS A NONSMOKER: MAKE IT FOR A MONTH, MAKE IT FOR LIFE Day 1: Hang  this page where you will see it every day. Day 2: Get rid of all ashtrays, matches, and lighters. Day 3: Drink water. Breathe deeply between sips. Day 4: Avoid places with smoke-filled air, such as bars, clubs, or the smoking section of restaurants. Day 5: Keep track of how much money you save by not smoking. Day 6: Avoid boredom. Keep a good book with you or go to the movies. Day 7: Reward yourself! One week without smoking! Day 8: Make a dental appointment to get your teeth cleaned. Day 9: Decide how you will turn down a cigarette before it is offered to you. Day 10: Review your reasons for quitting. Day 11: Distract yourself. Stay active to keep your mind off smoking and to relieve tension. Take a walk, exercise, read a book, do a crossword puzzle, or try a new hobby. Day 12: Exercise. Get off the bus before your stop or use stairs instead of escalators. Day 13: Call on friends for support and encouragement. Day 14: Reward yourself! Two weeks without smoking! Day 15: Practice deep breathing exercises. Day 16: Bet a friend that you can stay a nonsmoker. Day 17: Ask to sit in nonsmoking sections of restaurants. Day 18: Hang up "No Smoking" signs. Day 19: Think of yourself as a nonsmoker. Day 20: Each morning, tell yourself you will not smoke. Day 21: Reward yourself! Three weeks without smoking! Day 22: Think of smoking in negative ways. Remember how it stains your teeth, gives you bad breath, and leaves you short of breath. Day 23: Eat a nutritious breakfast. Day 24:Do not relive your days as a smoker. Day 25: Hold a pencil in your hand when talking on the telephone. Day 26: Tell all your friends you do not smoke. Day 27: Think about how much better food tastes. Day 28: Remember, one cigarette is one too many. Day 29: Take up a hobby that will keep your hands busy. Day 30: Congratulations! One month without smoking! Give yourself a big reward. Your caregiver can direct you to  community resources or hospitals for support, which may include:  Group support.  Education.  Hypnosis.  Subliminal therapy. Document Released: 03/22/2004 Document Revised: 09/16/2011 Document Reviewed: 04/10/2009 [facility] Patient Information 2014 [place], [place].

## 2013-02-05 ENCOUNTER — Telehealth: Payer: Self-pay

## 2013-02-05 LAB — COMPLETE METABOLIC PANEL WITH GFR
ALT: 43 U/L (ref 0–53)
AST: 26 U/L (ref 0–37)
Albumin: 4.9 g/dL (ref 3.5–5.2)
CO2: 25 mEq/L (ref 19–32)
Calcium: 9.8 mg/dL (ref 8.4–10.5)
Chloride: 105 mEq/L (ref 96–112)
Creat: 1.3 mg/dL (ref 0.50–1.35)
GFR, Est African American: 85 mL/min
Potassium: 3.9 mEq/L (ref 3.5–5.3)
Total Protein: 8.6 g/dL — ABNORMAL HIGH (ref 6.0–8.3)

## 2013-02-05 NOTE — Telephone Encounter (Signed)
Patient informed of labs results.  

## 2013-02-05 NOTE — Progress Notes (Signed)
Quick Note:  Please inform patient that his labs came back OK but he needs to continue to drink extra clear fluids to flush his kidneys. His levels are improving. Recheck CMP in 6 weeks.   Rodney Langton, MD, CDE, FAAFP [facility] [place], [place]   ______

## 2013-02-05 NOTE — Telephone Encounter (Signed)
Message copied by Lestine Mount on Fri Feb 05, 2013 10:36 AM ------      Message from: Cleora Fleet      Created: Fri Feb 05, 2013  7:37 AM       Please inform patient that his labs came back OK but he needs to continue to drink extra clear fluids to flush his kidneys. His levels are improving.  Recheck CMP in 6 weeks.              Rodney Langton, MD, CDE, FAAFP      [facility]      [facility]      [place], [place]        ------

## 2013-02-09 ENCOUNTER — Encounter (HOSPITAL_COMMUNITY): Payer: Self-pay

## 2013-02-09 ENCOUNTER — Ambulatory Visit (HOSPITAL_COMMUNITY)
Admission: RE | Admit: 2013-02-09 | Discharge: 2013-02-09 | Disposition: A | Discharge status: Home / Self Care | Payer: Self-pay | Source: Ambulatory Visit | Attending: Family Medicine | Admitting: Family Medicine

## 2013-02-09 ENCOUNTER — Telehealth: Payer: Self-pay | Admitting: *Deleted

## 2013-02-09 DIAGNOSIS — N179 Acute kidney failure, unspecified: Secondary | ICD-10-CM

## 2013-02-09 DIAGNOSIS — R935 Abnormal findings on diagnostic imaging of other abdominal regions, including retroperitoneum: Secondary | ICD-10-CM | POA: Insufficient documentation

## 2013-02-09 DIAGNOSIS — K7689 Other specified diseases of liver: Secondary | ICD-10-CM | POA: Insufficient documentation

## 2013-02-09 DIAGNOSIS — M6282 Rhabdomyolysis: Secondary | ICD-10-CM

## 2013-02-09 DIAGNOSIS — N281 Cyst of kidney, acquired: Secondary | ICD-10-CM

## 2013-02-09 DIAGNOSIS — E860 Dehydration: Secondary | ICD-10-CM

## 2013-02-09 NOTE — Progress Notes (Signed)
Quick Note:  Please inform patient that the CT scan results reveal no evidence of right renal mass. Mild fatty liver was seen. Normal stomach, pancreas, gallbladder, biliary tract, adrenal glands. I recommend that he followup in the next 2 weeks for an office visit to discuss the CT scan results with a provider.   Rodney Langton, MD, CDE, FAAFP [facility] [place], [place]   ______

## 2013-02-09 NOTE — Telephone Encounter (Signed)
02/09/13 Patient not available message left via telephone results of CT SCAN. Please inform patient that the CT scan results reveal no evidence of right renal mass. Mild fatty liver was seen. Normal stomach, pancreas, gallbladder, biliary tract, adrenal glands. I recommend that he followup in the next 2 weeks for an office visit to discuss the CT scan results with a provider [facility]

## 2013-06-16 ENCOUNTER — Emergency Department (HOSPITAL_COMMUNITY): Payer: 59

## 2013-06-16 ENCOUNTER — Encounter (HOSPITAL_COMMUNITY): Payer: Self-pay | Admitting: Emergency Medicine

## 2013-06-16 ENCOUNTER — Emergency Department (HOSPITAL_COMMUNITY)
Admission: EM | Admit: 2013-06-16 | Discharge: 2013-06-16 | Disposition: A | Discharge status: Home / Self Care | Payer: 59 | Attending: Emergency Medicine | Admitting: Emergency Medicine

## 2013-06-16 DIAGNOSIS — F17200 Nicotine dependence, unspecified, uncomplicated: Secondary | ICD-10-CM | POA: Insufficient documentation

## 2013-06-16 DIAGNOSIS — M25572 Pain in left ankle and joints of left foot: Secondary | ICD-10-CM

## 2013-06-16 DIAGNOSIS — Z8669 Personal history of other diseases of the nervous system and sense organs: Secondary | ICD-10-CM | POA: Insufficient documentation

## 2013-06-16 DIAGNOSIS — M25579 Pain in unspecified ankle and joints of unspecified foot: Secondary | ICD-10-CM | POA: Insufficient documentation

## 2013-06-16 DIAGNOSIS — J45909 Unspecified asthma, uncomplicated: Secondary | ICD-10-CM | POA: Insufficient documentation

## 2013-06-16 MED ORDER — HYDROCODONE-ACETAMINOPHEN 5-325 MG PO TABS: 1.0000 | ORAL_TABLET | Freq: Four times a day (QID) | ORAL | Status: DC | PRN

## 2013-06-16 NOTE — ED Notes (Signed)
Pt states he has had L foot pain since Monday. Pt states he woke up with the pain in his L foot. States pain is at top of foot near ankle. Pt is concerned he may have gout. Pt denies any injury or trauma to foot. Pt ambulatory to exam room with steady gait.

## 2013-06-16 NOTE — ED Provider Notes (Signed)
CSN: [id number]     Arrival date & time 06/16/13  1608 History  This chart was scribed for non-physician practitioner, Roxy Horseman, PA-C,working with Raeford Razor, MD, by Karle Plumber, ED Scribe.  This patient was seen in room WTR8/WTR8 and the patient's care was started at 4:42 PM.  Chief Complaint  Patient presents with  . Foot Pain   HPI HPI Comments:  [name] is a 27 y.o. male who presents to the Emergency Department complaining of worsening severe pain of his left foot upon waking up three days ago. Pt states he has had similar pain before. Pt has been able to ambulate without issue. He denies injury or falls. He states he does not have a PCP. 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 He reports h/o sleep apnea and asthma.   Past Medical History  Diagnosis Date  . Sleep apnea   . Asthma    No past surgical history on file. Family History  Problem Relation Age of Onset  . Sarcoidosis Mother   . Hypertension Father    History  Substance Use Topics  . Smoking status: Current Some Day Smoker    Types: Cigars  . Smokeless tobacco: Never Used  . Alcohol Use: Yes     Comment: occasionally    Review of Systems A complete 10 system review of systems was obtained and all systems are negative except as noted in the HPI and PMH.   Allergies  Review of patient's allergies indicates no known allergies.  Home Medications  No current outpatient prescriptions on file. Triage Vitals: BP 128/82  Pulse 105  Temp(Src) 98.9 F (37.2 C) (Oral)  Resp 20  SpO2 100% Physical Exam  Nursing note and vitals reviewed. Constitutional: He is oriented to person, place, and time. He appears well-developed and well-nourished.  HENT:  Head: Normocephalic and atraumatic.  Neck: Neck supple.  Cardiovascular: Intact distal pulses.   Brisk capillary refill.  Pulmonary/Chest: Effort normal.  Musculoskeletal: Normal range of motion. He exhibits no edema.  Left ankle  tender to palpation over the anterior aspect. No bony tenderness, abnormality, or deformity. No swelling. No erythema or signs of infection. ROM and strength 5/5.  Neurological: He is alert and oriented to person, place, and time. No cranial nerve deficit.  Sensation intact.  Skin: Skin is warm and dry.  Psychiatric: He has a normal mood and affect. His behavior is normal.    ED Course  Procedures (including critical care time) DIAGNOSTIC STUDIES: Oxygen Saturation is 100% on RA, normal by my interpretation.   COORDINATION OF CARE: 4:47 PM- Will obtain an X-Ray of left ankle.Pt verbalizes understanding and agrees to plan.  Medications - No data to display  Results for orders placed in visit on 02/04/13  COMPLETE METABOLIC PANEL WITH GFR      Result Value Range   Sodium 140  135 - 145 mEq/L   Potassium 3.9  3.5 - 5.3 mEq/L   Chloride 105  96 - 112 mEq/L   CO2 25  19 - 32 mEq/L   Glucose, Bld 88  70 - 99 mg/dL   BUN 14  6 - 23 mg/dL   Creat 4.09  8.11 - 9.14 mg/dL   Total Bilirubin 0.3  0.3 - 1.2 mg/dL   Alkaline Phosphatase 38 (*) 39 - 117 U/L   AST 26  0 - 37 U/L   ALT 43  0 - 53 U/L   Total Protein 8.6 (*) 6.0 - 8.3 g/dL  Albumin 4.9  3.5 - 5.2 g/dL   Calcium 9.8  8.4 - 09.8 mg/dL   GFR, Est African American 85     GFR, Est Non African American 74    CK      Result Value Range   Total CK 432 (*) 7 - 232 U/L  CBC      Result Value Range   WBC 7.6  4.0 - 10.5 K/uL   RBC 5.46  4.22 - 5.81 MIL/uL   Hemoglobin 14.6  13.0 - 17.0 g/dL   HCT 11.9  14.7 - 82.9 %   MCV 80.4  78.0 - 100.0 fL   MCH 26.7  26.0 - 34.0 pg   MCHC 33.3  30.0 - 36.0 g/dL   RDW 56.2  13.0 - 86.5 %   Platelets 424 (*) 150 - 400 K/uL   Dg Ankle Complete Left  06/16/2013   CLINICAL DATA:  Pain  EXAM: LEFT ANKLE COMPLETE - 3+ VIEW  COMPARISON:  None.  FINDINGS: Frontal, oblique, and lateral views were obtained. No fracture or effusion. Ankle mortise appears intact.  IMPRESSION: No abnormality noted.    Electronically Signed   By: Bretta Bang M.D.   On: 06/16/2013 17:34      EKG Interpretation   None       MDM   1. Ankle pain, left    Patient with ankle pain. 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 Plain films negative. No evidence of infection, no erythema, fever, or range of motion deficits. No unilateral leg swelling, or calf, or popliteal tenderness.  I personally performed the services described in this documentation, which was scribed in my presence. The recorded information has been reviewed and is accurate.     Roxy Horseman, PA-C 06/16/13 [phone]

## 2013-06-19 NOTE — ED Provider Notes (Signed)
Medical screening examination/treatment/procedure(s) were performed by non-physician practitioner and as supervising physician I was immediately available for consultation/collaboration.  EKG Interpretation   None        Alyzah Pelly, MD 06/19/13 0749 

## 2013-11-03 ENCOUNTER — Emergency Department (HOSPITAL_COMMUNITY)
Admission: EM | Admit: 2013-11-03 | Discharge: 2013-11-03 | Disposition: A | Discharge status: Home / Self Care | Payer: 59 | Attending: Emergency Medicine | Admitting: Emergency Medicine

## 2013-11-03 DIAGNOSIS — Z79899 Other long term (current) drug therapy: Secondary | ICD-10-CM | POA: Insufficient documentation

## 2013-11-03 DIAGNOSIS — Z8669 Personal history of other diseases of the nervous system and sense organs: Secondary | ICD-10-CM | POA: Insufficient documentation

## 2013-11-03 DIAGNOSIS — R51 Headache: Secondary | ICD-10-CM | POA: Insufficient documentation

## 2013-11-03 DIAGNOSIS — F17200 Nicotine dependence, unspecified, uncomplicated: Secondary | ICD-10-CM | POA: Insufficient documentation

## 2013-11-03 DIAGNOSIS — J069 Acute upper respiratory infection, unspecified: Secondary | ICD-10-CM | POA: Insufficient documentation

## 2013-11-03 MED ORDER — BENZONATATE 100 MG PO CAPS: 100.0000 mg | ORAL_CAPSULE | Freq: Three times a day (TID) | ORAL | Status: DC

## 2013-11-03 MED ORDER — IBUPROFEN 800 MG PO TABS: 800.0000 mg | ORAL_TABLET | Freq: Three times a day (TID) | ORAL | Status: AC

## 2013-11-03 MED ORDER — DM-GUAIFENESIN ER 30-600 MG PO TB12: 1.0000 | ORAL_TABLET | Freq: Two times a day (BID) | ORAL | Status: DC

## 2013-11-03 NOTE — ED Provider Notes (Signed)
CSN: [id number]     Arrival date & time 11/03/13  1420 History  This chart was scribed for non-physician practitioner Mellody DrownLauren Amariyon Maynes working with No att. providers found by Carl Bestelina Holson, ED Scribe. This patient was seen in room TR09C/TR09C and the patient's care was started at 2:27 PM.      No chief complaint on file.  HPI Comments: [name] is a 28 y.o. male who presents to the Emergency Department complaining of constant cough and congestion that started two days ago.  The patient states that his symptoms started with a headache and the cough progressively worsened Monday night.  He states that his cough is productive of mucous with occassional strands of blood present in it.  The patient denies epistaxis and fever as associated symptoms.  He states that he has taken Zyrtec and Sudafed cold and sinus with no relief to his symptoms.  He denies having a history of hypertension.  He denies having a PCP.   The history is provided by the patient. No language interpreter was used.    Past Medical History  Diagnosis Date  . Sleep apnea   . Asthma    No past surgical history on file. Family History  Problem Relation Age of Onset  . Sarcoidosis Mother   . Hypertension Father    History  Substance Use Topics  . Smoking status: Current Some Day Smoker    Types: Cigars  . Smokeless tobacco: Never Used  . Alcohol Use: Yes     Comment: occasionally    Review of Systems  Constitutional: Negative for fever.  HENT: Positive for congestion, sinus pressure and trouble swallowing.   Respiratory: Positive for cough.   Neurological: Positive for headaches.  All other systems reviewed and are negative.     Allergies  Review of patient's allergies indicates no known allergies.  Home Medications   Prior to Admission medications   Medication Sig Start Date End Date Taking? Authorizing Provider  HYDROcodone-acetaminophen (NORCO/VICODIN) 5-325 MG per tablet Take 1-2 tablets by mouth  every 6 (six) hours as needed. 06/16/13   Roxy Horsemanobert Browning, PA-C   There were no vitals taken for this visit. Physical Exam  Nursing note and vitals reviewed. Constitutional: He is oriented to person, place, and time. He appears well-developed and well-nourished. No distress.  HENT:  Head: Normocephalic and atraumatic.  Right Ear: Hearing, tympanic membrane, external ear and ear canal normal. No middle ear effusion.  Left Ear: Hearing, tympanic membrane, external ear and ear canal normal.  No middle ear effusion.  Nose: Rhinorrhea present. Right sinus exhibits frontal sinus tenderness. Right sinus exhibits no maxillary sinus tenderness. Left sinus exhibits frontal sinus tenderness. Left sinus exhibits no maxillary sinus tenderness.  Mouth/Throat: Uvula is midline and mucous membranes are normal. No trismus in the jaw. Posterior oropharyngeal erythema present. No oropharyngeal exudate or posterior oropharyngeal edema.  Eyes: Conjunctivae and EOM are normal. Pupils are equal, round, and reactive to light. Right eye exhibits no discharge. Left eye exhibits no discharge.  Neck: Normal range of motion.  Cardiovascular: Normal rate and regular rhythm.   No murmur heard. Pulmonary/Chest: Effort normal and breath sounds normal. No respiratory distress. He has no decreased breath sounds. He has no wheezes. He has no rhonchi. He has no rales.  Musculoskeletal: Normal range of motion.  Lymphadenopathy:       Head (right side): No tonsillar adenopathy present.       Head (left side): No tonsillar adenopathy present.  He has no cervical adenopathy.    He has no axillary adenopathy.  Neurological: He is alert and oriented to person, place, and time.  Skin: Skin is warm and dry. He is not diaphoretic.  Psychiatric: He has a normal mood and affect. His behavior is normal.    ED Course  Procedures (including critical care time)  COORDINATION OF CARE: 2:48 PM- Discussed a clinical suspicion of a viral  infection.  Advised the patient to take Mucinex DM twice a day to treat his congestion and to drink plenty of fluids.  Discussed discharging the patient with a prescription for cough medication.  The patient agreed to the treatment plan.   Labs Review Labs Reviewed - No data to display  Imaging Review No results found.   EKG Interpretation None      MDM   Final diagnoses:  Upper respiratory infection   Pt with nasal congestion, cough and sore throat. Patients symptoms are consistent with URI, likely viral etiology. Discussed that antibiotics are not indicated for viral infections. Pt will be discharged with symptomatic treatment.  Verbalizes understanding and is agreeable with plan. Pt is hemodynamically stable & in NAD prior to dc.  Meds given in ED:  Medications - No data to display  Discharge Medication List as of 11/03/2013  2:59 PM    START taking these medications   Details  benzonatate (TESSALON) 100 MG capsule Take 1 capsule (100 mg total) by mouth every 8 (eight) hours., Starting 11/03/2013, Until Discontinued, Print    dextromethorphan-guaiFENesin (MUCINEX DM) 30-600 MG per 12 hr tablet Take 1 tablet by mouth 2 (two) times daily., Starting 11/03/2013, Until Discontinued, Print    ibuprofen (ADVIL,MOTRIN) 800 MG tablet Take 1 tablet (800 mg total) by mouth 3 (three) times daily., Starting 11/03/2013, Until Discontinued, Print       I personally performed the services described in this documentation, which was scribed in my presence. The recorded information has been reviewed and is accurate.    Clabe SealLauren M Ozetta Flatley, PA-C 11/04/13 [phone]

## 2013-11-03 NOTE — ED Notes (Signed)
Patient to ED with cold Symptoms that began on Monday.  C/O cough, congestion, rhinorrhea, sore throat, wheezing and H/A.

## 2013-11-03 NOTE — Discharge Instructions (Signed)
Call for a follow up appointment with a Family or Primary Care Provider.  °Return if Symptoms worsen.   °Take medication as prescribed.  ° °

## 2013-11-05 NOTE — ED Provider Notes (Signed)
Medical screening examination/treatment/procedure(s) were performed by non-physician practitioner and as supervising physician I was immediately available for consultation/collaboration.   EKG Interpretation None       Kalifa Cadden R. Leigha Olberding, MD 11/05/13 0715 

## 2014-08-21 ENCOUNTER — Encounter (HOSPITAL_COMMUNITY): Payer: Self-pay

## 2014-08-21 ENCOUNTER — Emergency Department (HOSPITAL_COMMUNITY): Payer: 59

## 2014-08-21 ENCOUNTER — Observation Stay (HOSPITAL_COMMUNITY)
Admission: EM | Admit: 2014-08-21 | Discharge: 2014-08-23 | Disposition: A | Discharge status: Home / Self Care | Payer: 59 | Attending: Internal Medicine | Admitting: Internal Medicine

## 2014-08-21 DIAGNOSIS — G4733 Obstructive sleep apnea (adult) (pediatric): Secondary | ICD-10-CM | POA: Insufficient documentation

## 2014-08-21 DIAGNOSIS — R059 Cough, unspecified: Secondary | ICD-10-CM | POA: Diagnosis present

## 2014-08-21 DIAGNOSIS — J4520 Mild intermittent asthma, uncomplicated: Secondary | ICD-10-CM

## 2014-08-21 DIAGNOSIS — R0602 Shortness of breath: Secondary | ICD-10-CM | POA: Diagnosis present

## 2014-08-21 DIAGNOSIS — J101 Influenza due to other identified influenza virus with other respiratory manifestations: Secondary | ICD-10-CM | POA: Diagnosis present

## 2014-08-21 DIAGNOSIS — J09X2 Influenza due to identified novel influenza A virus with other respiratory manifestations: Principal | ICD-10-CM | POA: Insufficient documentation

## 2014-08-21 DIAGNOSIS — N179 Acute kidney failure, unspecified: Secondary | ICD-10-CM

## 2014-08-21 DIAGNOSIS — R05 Cough: Secondary | ICD-10-CM

## 2014-08-21 DIAGNOSIS — Z79899 Other long term (current) drug therapy: Secondary | ICD-10-CM | POA: Insufficient documentation

## 2014-08-21 DIAGNOSIS — N170 Acute kidney failure with tubular necrosis: Secondary | ICD-10-CM | POA: Insufficient documentation

## 2014-08-21 DIAGNOSIS — Z720 Tobacco use: Secondary | ICD-10-CM | POA: Diagnosis present

## 2014-08-21 DIAGNOSIS — F1010 Alcohol abuse, uncomplicated: Secondary | ICD-10-CM | POA: Insufficient documentation

## 2014-08-21 DIAGNOSIS — E860 Dehydration: Secondary | ICD-10-CM

## 2014-08-21 DIAGNOSIS — Z9989 Dependence on other enabling machines and devices: Secondary | ICD-10-CM

## 2014-08-21 DIAGNOSIS — J45909 Unspecified asthma, uncomplicated: Secondary | ICD-10-CM | POA: Insufficient documentation

## 2014-08-21 DIAGNOSIS — N178 Other acute kidney failure: Secondary | ICD-10-CM

## 2014-08-21 DIAGNOSIS — F17210 Nicotine dependence, cigarettes, uncomplicated: Secondary | ICD-10-CM | POA: Insufficient documentation

## 2014-08-21 LAB — BASIC METABOLIC PANEL
Anion gap: 6 (ref 5–15)
BUN: 10 mg/dL (ref 6–23)
CO2: 26 mmol/L (ref 19–32)
Calcium: 9.5 mg/dL (ref 8.4–10.5)
Chloride: 104 mmol/L (ref 96–112)
Creatinine, Ser: 1.39 mg/dL — ABNORMAL HIGH (ref 0.50–1.35)
GFR calc non Af Amer: 67 mL/min — ABNORMAL LOW (ref >90)
GFR, EST AFRICAN AMERICAN: 77 mL/min — AB (ref >90)
Glucose, Bld: 90 mg/dL (ref 70–99)
POTASSIUM: 3.8 mmol/L (ref 3.5–5.1)
Sodium: 136 mmol/L (ref 135–145)

## 2014-08-21 LAB — CBC WITH DIFFERENTIAL/PLATELET
Basophils Absolute: 0 10*3/uL (ref 0.0–0.1)
Basophils Relative: 0 % (ref 0–1)
EOS ABS: 0.2 10*3/uL (ref 0.0–0.7)
Eosinophils Relative: 2 % (ref 0–5)
HCT: 44.5 % (ref 39.0–52.0)
HEMOGLOBIN: 14.6 g/dL (ref 13.0–17.0)
LYMPHS PCT: 23 % (ref 12–46)
Lymphs Abs: 2.1 10*3/uL (ref 0.7–4.0)
MCH: 27.5 pg (ref 26.0–34.0)
MCHC: 32.8 g/dL (ref 30.0–36.0)
MCV: 83.8 fL (ref 78.0–100.0)
MONOS PCT: 10 % (ref 3–12)
Monocytes Absolute: 0.9 10*3/uL (ref 0.1–1.0)
Neutro Abs: 6 10*3/uL (ref 1.7–7.7)
Neutrophils Relative %: 65 % (ref 43–77)
Platelets: 363 10*3/uL (ref 150–400)
RBC: 5.31 MIL/uL (ref 4.22–5.81)
RDW: 13.6 % (ref 11.5–15.5)
WBC: 9.3 10*3/uL (ref 4.0–10.5)

## 2014-08-21 LAB — TROPONIN I: Troponin I: <0.03 ng/mL (ref <0.031)

## 2014-08-21 LAB — BRAIN NATRIURETIC PEPTIDE: B Natriuretic Peptide: 24.5 pg/mL (ref 0.0–100.0)

## 2014-08-21 MED ORDER — VITAMIN B-1 100 MG PO TABS
100.0000 mg | ORAL_TABLET | Freq: Every day | ORAL | Status: DC
  Administered 2014-08-21 – 2014-08-23 (×3): 100 mg via ORAL
  Filled 2014-08-21 (×3): qty 1

## 2014-08-21 MED ORDER — LORAZEPAM 2 MG/ML IJ SOLN: 0.0000 mg | Freq: Two times a day (BID) | INTRAMUSCULAR | Status: DC

## 2014-08-21 MED ORDER — ADULT MULTIVITAMIN W/MINERALS CH
1.0000 | ORAL_TABLET | Freq: Every day | ORAL | Status: DC
  Administered 2014-08-21 – 2014-08-23 (×3): 1 via ORAL
  Filled 2014-08-21 (×3): qty 1

## 2014-08-21 MED ORDER — NICOTINE 14 MG/24HR TD PT24
14.0000 mg | MEDICATED_PATCH | Freq: Every day | TRANSDERMAL | Status: DC
Start: 2014-08-21 — End: 2014-08-23
  Administered 2014-08-21 – 2014-08-23 (×3): 14 mg via TRANSDERMAL
  Filled 2014-08-21 (×3): qty 1

## 2014-08-21 MED ORDER — ACETAMINOPHEN 500 MG PO TABS: 1000.0000 mg | ORAL_TABLET | Freq: Four times a day (QID) | ORAL | Status: DC | PRN

## 2014-08-21 MED ORDER — SODIUM CHLORIDE 0.9 % IV BOLUS (SEPSIS)
1000.0000 mL | Freq: Once | INTRAVENOUS | Status: AC
  Administered 2014-08-21: 1000 mL via INTRAVENOUS

## 2014-08-21 MED ORDER — ASPIRIN EC 81 MG PO TBEC
81.0000 mg | DELAYED_RELEASE_TABLET | Freq: Every day | ORAL | Status: DC
  Administered 2014-08-21 – 2014-08-23 (×3): 81 mg via ORAL
  Filled 2014-08-21 (×3): qty 1

## 2014-08-21 MED ORDER — ALBUTEROL SULFATE (2.5 MG/3ML) 0.083% IN NEBU: 5.0000 mg | INHALATION_SOLUTION | RESPIRATORY_TRACT | Status: DC | PRN

## 2014-08-21 MED ORDER — IPRATROPIUM BROMIDE 0.02 % IN SOLN
0.5000 mg | Freq: Four times a day (QID) | RESPIRATORY_TRACT | Status: DC
  Administered 2014-08-22 (×3): 0.5 mg via RESPIRATORY_TRACT
  Filled 2014-08-21 (×4): qty 2.5

## 2014-08-21 MED ORDER — ACETAMINOPHEN 325 MG PO TABS
650.0000 mg | ORAL_TABLET | Freq: Four times a day (QID) | ORAL | Status: DC | PRN
  Administered 2014-08-22: 650 mg via ORAL
  Filled 2014-08-21: qty 2

## 2014-08-21 MED ORDER — DM-GUAIFENESIN ER 30-600 MG PO TB12
1.0000 | ORAL_TABLET | Freq: Two times a day (BID) | ORAL | Status: DC
  Administered 2014-08-21 – 2014-08-22 (×3): 1 via ORAL
  Filled 2014-08-21 (×4): qty 1

## 2014-08-21 MED ORDER — IPRATROPIUM BROMIDE 0.02 % IN SOLN
0.5000 mg | Freq: Once | RESPIRATORY_TRACT | Status: AC
  Administered 2014-08-21: 0.5 mg via RESPIRATORY_TRACT
  Filled 2014-08-21: qty 2.5

## 2014-08-21 MED ORDER — LORAZEPAM 1 MG PO TABS: 1.0000 mg | ORAL_TABLET | Freq: Four times a day (QID) | ORAL | Status: DC | PRN

## 2014-08-21 MED ORDER — METHYLPREDNISOLONE SODIUM SUCC 125 MG IJ SOLR
125.0000 mg | Freq: Once | INTRAMUSCULAR | Status: AC
Start: 2014-08-21 — End: 2014-08-21
  Administered 2014-08-21: 125 mg via INTRAVENOUS
  Filled 2014-08-21: qty 2

## 2014-08-21 MED ORDER — IOHEXOL 350 MG/ML SOLN
100.0000 mL | Freq: Once | INTRAVENOUS | Status: AC | PRN
  Administered 2014-08-21: 100 mL via INTRAVENOUS

## 2014-08-21 MED ORDER — NITROGLYCERIN 0.4 MG SL SUBL: 0.4000 mg | SUBLINGUAL_TABLET | SUBLINGUAL | Status: DC | PRN

## 2014-08-21 MED ORDER — SODIUM CHLORIDE 0.9 % IV SOLN
INTRAVENOUS | Status: DC
Start: 2014-08-21 — End: 2014-08-23
  Administered 2014-08-22 – 2014-08-23 (×4): via INTRAVENOUS

## 2014-08-21 MED ORDER — ALBUTEROL SULFATE (2.5 MG/3ML) 0.083% IN NEBU
5.0000 mg | INHALATION_SOLUTION | Freq: Once | RESPIRATORY_TRACT | Status: AC
  Administered 2014-08-21: 5 mg via RESPIRATORY_TRACT
  Filled 2014-08-21: qty 6

## 2014-08-21 MED ORDER — HEPARIN SODIUM (PORCINE) 5000 UNIT/ML IJ SOLN
5000.0000 [IU] | Freq: Three times a day (TID) | INTRAMUSCULAR | Status: DC
  Administered 2014-08-21 – 2014-08-23 (×5): 5000 [IU] via SUBCUTANEOUS
  Filled 2014-08-21 (×6): qty 1

## 2014-08-21 MED ORDER — FOLIC ACID 1 MG PO TABS
1.0000 mg | ORAL_TABLET | Freq: Every day | ORAL | Status: DC
  Administered 2014-08-21 – 2014-08-23 (×3): 1 mg via ORAL
  Filled 2014-08-21 (×3): qty 1

## 2014-08-21 MED ORDER — LORAZEPAM 2 MG/ML IJ SOLN: 0.0000 mg | Freq: Four times a day (QID) | INTRAMUSCULAR | Status: DC

## 2014-08-21 MED ORDER — LORATADINE 10 MG PO TABS
10.0000 mg | ORAL_TABLET | Freq: Every day | ORAL | Status: DC
  Administered 2014-08-22 – 2014-08-23 (×2): 10 mg via ORAL
  Filled 2014-08-21 (×2): qty 1

## 2014-08-21 MED ORDER — SODIUM CHLORIDE 0.9 % IJ SOLN
3.0000 mL | Freq: Two times a day (BID) | INTRAMUSCULAR | Status: DC
  Administered 2014-08-21: 3 mL via INTRAVENOUS

## 2014-08-21 MED ORDER — ACETAMINOPHEN 650 MG RE SUPP: 650.0000 mg | Freq: Four times a day (QID) | RECTAL | Status: DC | PRN

## 2014-08-21 MED ORDER — LORAZEPAM 2 MG/ML IJ SOLN: 1.0000 mg | Freq: Four times a day (QID) | INTRAMUSCULAR | Status: DC | PRN

## 2014-08-21 MED ORDER — THIAMINE HCL 100 MG/ML IJ SOLN
100.0000 mg | Freq: Every day | INTRAMUSCULAR | Status: DC
  Filled 2014-08-21 (×3): qty 1

## 2014-08-21 NOTE — ED Notes (Signed)
Patient c/o a productive cough with clear sputum, dizziness, and SOB x 2 days.

## 2014-08-21 NOTE — H&P (Addendum)
[facility] History and Physical  Yago Ludvigsen NWG:[id number] DOB: 04-Sep-1985 DOA: 08/21/2014  Referring physician: ED physician PCP: [name], MD  Specialists:   Chief Complaint: SOB, cough, chest tightness  HPI: [name] is a 29 y.o. male with PMH of asthma, OSA on CPAP, tobacco abuse, alcohol abuse, who presents with cough, shortness breath, chest tightness.  Patient reports that he started coughing 2 days ago. He also mild shortness of breath, headache, dizziness, sore throat, but no runny nose, fever or chills. He coughs up some clear mucus. No sick contact. He reports having mild chest tightness, but no true chest pain. No pleuritic chest pain. No pain over the calf area. Patient reports that he had burning on urination for 3 days in last week, which has resolved already. He continues to smoke 2 cigars cigarettes daily. He drinks very little, last drink was 2 month ago.  Patient denies fever, chills, abdominal pain, diarrhea, constipation, hematuria, skin rashes, joint pain or leg swelling. No unilateral weakness, numbness or tingling sensations. No vision change or hearing loss.  In ED, patient was found to have tachycardia, WBC=9.3, BNP 24.5, AKI, normal temperature. Chest x-ray is negative for acute abnormalities. CT angiogram of chest is negative for PE and pneumonia. Patient is admitted to inpatient for further evaluation and treatment.  Review of Systems: As presented in the history of presenting illness, rest negative.  Where does patient live?  At home Can patient participate in ADLs? Yes  Allergy: No Known Allergies  Past Medical History  Diagnosis Date  . Sleep apnea   . Asthma     History reviewed. No pertinent past surgical history.  Social History:  reports that he has been smoking Cigars.  He has never used smokeless tobacco. He reports that he drinks alcohol. He reports that he does not use illicit drugs.  Family History:  Family  History  Problem Relation Age of Onset  . Sarcoidosis Mother   . Hypertension Father      Prior to Admission medications   Medication Sig Start Date End Date Taking? Authorizing Provider  acetaminophen (TYLENOL) 500 MG tablet Take 1,000 mg by mouth every 6 (six) hours as needed for moderate pain or headache.   Yes Historical Provider, MD  cetirizine (ZYRTEC) 10 MG tablet Take 10 mg by mouth daily as needed for allergies.    Yes Historical Provider, MD  ibuprofen (ADVIL,MOTRIN) 200 MG tablet Take 400 mg by mouth every 6 (six) hours as needed for headache or moderate pain.   Yes Historical Provider, MD  naproxen sodium (ANAPROX) 220 MG tablet Take 440 mg by mouth 2 (two) times daily as needed (pain).   Yes Historical Provider, MD  benzonatate (TESSALON) 100 MG capsule Take 1 capsule (100 mg total) by mouth every 8 (eight) hours. Patient not taking: Reported on 08/21/2014 11/03/13   Mellody Drown, PA-C  dextromethorphan-guaiFENesin [facility] DM) 30-600 MG per 12 hr tablet Take 1 tablet by mouth 2 (two) times daily. Patient not taking: Reported on 08/21/2014 11/03/13   Mellody Drown, PA-C  ibuprofen (ADVIL,MOTRIN) 800 MG tablet Take 1 tablet (800 mg total) by mouth 3 (three) times daily. Patient not taking: Reported on 08/21/2014 11/03/13   Mellody Drown, PA-C    Physical Exam: Filed Vitals:   08/21/14 1723 08/21/14 2115  BP: 120/70 107/53  Pulse: 116 117  Temp: 98.7 F (37.1 C)   TempSrc: Oral   Resp: 22   SpO2: 98% 96%   General: Not in acute  distress, dry mucous and a membrane HEENT:       Eyes: PERRL, EOMI, no scleral icterus       ENT: No discharge from the ears and nose, no pharynx injection, no tonsillar enlargement.        Neck: No JVD, no bruit, no mass felt. Cardiac: S1/S2, RRR, No murmurs, No gallops or rubs Pulm: Good air movement bilaterally. Clear to auscultation bilaterally. No rales, wheezing, rhonchi or rubs. Abd: Soft, nondistended, nontender, no rebound pain, no  organomegaly, BS present Ext: No edema bilaterally. 2+DP/PT pulse bilaterally Musculoskeletal: No joint deformities, erythema, or stiffness, ROM full Skin: No rashes.  Neuro: Alert and oriented X3, cranial nerves II-XII grossly intact, muscle strength 5/5 in all extremeties, sensation to light touch intact. Brachial reflex 2+ bilaterally. Knee reflex 1+ bilaterally. Negative Babinski's sign. Normal finger to nose test. Psych: Patient is not psychotic, no suicidal or hemocidal ideation.  Labs on Admission:  Basic Metabolic Panel:  Recent Labs Lab 08/21/14 1822  NA 136  K 3.8  CL 104  CO2 26  GLUCOSE 90  BUN 10  CREATININE 1.39*  CALCIUM 9.5   Liver Function Tests: No results for input(s): AST, ALT, ALKPHOS, BILITOT, PROT, ALBUMIN in the last 168 hours. No results for input(s): LIPASE, AMYLASE in the last 168 hours. No results for input(s): AMMONIA in the last 168 hours. CBC:  Recent Labs Lab 08/21/14 1822  WBC 9.3  NEUTROABS 6.0  HGB 14.6  HCT 44.5  MCV 83.8  PLT 363   Cardiac Enzymes: No results for input(s): CKTOTAL, CKMB, CKMBINDEX, TROPONINI in the last 168 hours.  BNP (last 3 results)  Recent Labs  08/21/14 1822  BNP 24.5    ProBNP (last 3 results) No results for input(s): PROBNP in the last 8760 hours.  CBG: No results for input(s): GLUCAP in the last 168 hours.  Radiological Exams on Admission: Dg Chest 2 View  08/21/2014   CLINICAL DATA:  Cough and chest pain for 2 days. Shortness of breath. Dizziness. Asthma.  EXAM: CHEST  2 VIEW  COMPARISON:  01/28/2013  FINDINGS: The heart size and mediastinal contours are within normal limits. Both lungs are clear. The visualized skeletal structures are unremarkable.  IMPRESSION: Negative.  No active cardiopulmonary disease.   Electronically Signed   By: Myles Rosenthal M.D.   On: 08/21/2014 17:49   Ct Angio Chest Pe W/cm &/or Wo Cm  08/21/2014   CLINICAL DATA:  Shortness of Breath for 2 days  EXAM: CT ANGIOGRAPHY  CHEST WITH CONTRAST  TECHNIQUE: Multidetector CT imaging of the chest was performed using the standard protocol during bolus administration of intravenous contrast. Multiplanar CT image reconstructions and MIPs were obtained to evaluate the vascular anatomy.  CONTRAST:  OMNIPAQUE IOHEXOL 350 MG/ML SOLN  COMPARISON:  Chest radiograph August 21, 2014  FINDINGS: There is no demonstrable pulmonary embolus. There is no thoracic aortic aneurysm or dissection.  There is mild dependent type atelectasis in the lung bases. There is no lung edema or consolidation. There is no appreciable thoracic adenopathy. The pericardium is not thickened.  Visualized upper abdominal structures appear normal. Thyroid appears unremarkable. There are no blastic or lytic bone lesions.  Review of the MIP images confirms the above findings.  IMPRESSION: No demonstrable pulmonary embolus. Mild bibasilar atelectatic type change. No edema or consolidation.   Electronically Signed   By: [name] M.D.   On: 08/21/2014 20:09    EKG: Independently reviewed. No ischemic change  Assessment/Plan Principal Problem:   SOB (shortness of breath) Active Problems:   Tobacco abuse   OSA on CPAP   Dehydration   Acute renal failure   Asthma   Cough  SOB and cough: Unclear etiology. Patient has history of asthma, but he does not have any wheezing or rhonchi on auscultation, dose not seem to have asthma exacerbation. CTA-chest and x-ray did not show pneumonia. No PE on CTA. Patient has some symptoms for viral infection, including coughing, sore throat and headache, will rule out flu.  -Admit to telemetry bed for observation  -Check flu PCR -Symptomatic treatment: Albuterol and Atrovent nebulizers, plus Mucinex for cough  Chest tightness and tachycardia: Etiology is not clear. Patient does not seem to be septic. Differential diagnoses include dehydration, alcohol drinking and drug abuse. EKG has no ischemic change - ASA and  NTG prn -Troponin 3 -Repeat EKG in the morning  - check UDS and alcohol level - IV fluids: Received 1 L of normal saline bolus in the emergency room, followed by 150 mL per hour  AKI: Partially due to dehydration, but BUN 10/creatinine 1.39, ratio is not consistent with dehydration. Renal toxicity from ibuprofen and naproxen,   Patient reports he had burning on urination for 3 days in the last week, it ache and a possible UTI. -FeNa -US-renal -hold NSAIDs -IVF as above -Follow-up renal function by BMP -UA and Urine culture -CK -GC/chalamydai prob, HIV Ab  OSA: -CPAP  Tobacco abuse: -Codeine patch  Alcohol abuse:  -CIWA protocol  DVT ppx: SQ Heparin       Code Status: Full code Family Communication:   Yes, patient's   wife    at bed side Disposition Plan: Admit to inpatient   Date of Service 08/21/2014    Lorretta HarpIU, Grantley Savage [facility] Pager [phone]  If 7PM-7AM, please contact night-coverage [email or web address] Password [email or web address] 08/21/2014, 9:29 PM

## 2014-08-21 NOTE — ED Notes (Signed)
Pt in radiology at this time. 

## 2014-08-21 NOTE — ED Provider Notes (Signed)
CSN: [id number]     Arrival date & time 08/21/14  1715 History   First MD Initiated Contact with Patient 08/21/14 1722     Chief Complaint  Patient presents with  . Cough  . Shortness of Breath     (Consider location/radiation/quality/duration/timing/severity/associated sxs/prior Treatment) HPI Comments: Patient here with cough with clear sputum 2 days. Notes dyspnea on exertion without orthopnea. Has a history of asthma and sleep apnea and he continues to smoke cigars. Denies any fever or chills. No vomiting or diarrhea. Denies any leg swelling. No leg pain as well. Symptoms persisted and better with rest. Has been using over-the-counter medication without relief. Denies any anginal quality to his symptoms.  Patient is a 29 y.o. male presenting with cough and shortness of breath. The history is provided by the patient.  Cough Associated symptoms: shortness of breath   Shortness of Breath Associated symptoms: cough     Past Medical History  Diagnosis Date  . Sleep apnea   . Asthma    History reviewed. No pertinent past surgical history. Family History  Problem Relation Age of Onset  . Sarcoidosis Mother   . Hypertension Father    History  Substance Use Topics  . Smoking status: Current Every Day Smoker    Types: Cigars  . Smokeless tobacco: Never Used  . Alcohol Use: Yes     Comment: occasionally    Review of Systems  Respiratory: Positive for cough and shortness of breath.   All other systems reviewed and are negative.     Allergies  Review of patient's allergies indicates no known allergies.  Home Medications   Prior to Admission medications   Medication Sig Start Date End Date Taking? Authorizing Provider  benzonatate (TESSALON) 100 MG capsule Take 1 capsule (100 mg total) by mouth every 8 (eight) hours. 11/03/13   Mellody DrownLauren Parker, PA-C  cetirizine (ZYRTEC) 10 MG tablet Take 10 mg by mouth daily.    Historical Provider, MD  dextromethorphan-guaiFENesin  (MUCINEX DM) 30-600 MG per 12 hr tablet Take 1 tablet by mouth 2 (two) times daily. 11/03/13   Mellody DrownLauren Parker, PA-C  ibuprofen (ADVIL,MOTRIN) 800 MG tablet Take 1 tablet (800 mg total) by mouth 3 (three) times daily. 11/03/13   Lauren Parker, PA-C   BP 120/70 mmHg  Pulse 116  Temp(Src) 98.7 F (37.1 C) (Oral)  Resp 22  SpO2 98% Physical Exam  Constitutional: He is oriented to person, place, and time. He appears well-developed and well-nourished.  Non-toxic appearance. No distress.  HENT:  Head: Normocephalic and atraumatic.  Eyes: Conjunctivae, EOM and lids are normal. Pupils are equal, round, and reactive to light.  Neck: Normal range of motion. Neck supple. No tracheal deviation present. No thyroid mass present.  Cardiovascular: Regular rhythm and normal heart sounds.  Tachycardia present.  Exam reveals no gallop.   No murmur heard. Pulmonary/Chest: Effort normal. No stridor. No respiratory distress. He has decreased breath sounds. He has no wheezes. He has no rhonchi. He has no rales.  Abdominal: Soft. Normal appearance and bowel sounds are normal. He exhibits no distension. There is no tenderness. There is no rebound and no CVA tenderness.  Musculoskeletal: Normal range of motion. He exhibits no edema or tenderness.  Neurological: He is alert and oriented to person, place, and time. He has normal strength. No cranial nerve deficit or sensory deficit. GCS eye subscore is 4. GCS verbal subscore is 5. GCS motor subscore is 6.  Skin: Skin is warm and dry. No  abrasion and no rash noted.  Psychiatric: He has a normal mood and affect. His speech is normal and behavior is normal.  Nursing note and vitals reviewed.   ED Course  Procedures (including critical care time) Labs Review Labs Reviewed  CBC WITH DIFFERENTIAL/PLATELET  BASIC METABOLIC PANEL    Imaging Review No results found.   EKG Interpretation   Date/Time:  Sunday August 21 2014 17:22:44 EST Ventricular Rate:  115 PR  Interval:  186 QRS Duration: 94 QT Interval:  322 QTC Calculation: 445 R Axis:   107 Text Interpretation:  Sinus tachycardia Right axis deviation Low voltage,  precordial leads ST elev, probable normal early repol pattern Confirmed by  Loreta Blouch  MD, Conor Lata ([id number]) on 08/21/2014 5:36:50 PM      MDM   Final diagnoses:  SOB (shortness of breath)      Patient given Solu-Medrol as well as albuterol. He was tachycardic prior to this. CT scan was negative for pulmonary embolism. Due to his persistent tachycardia with associated weakness and dizziness he will be admitted by medicine for further evaluation.  Toy Baker, MD 08/21/14 [phone]

## 2014-08-22 ENCOUNTER — Observation Stay (HOSPITAL_COMMUNITY): Payer: 59

## 2014-08-22 DIAGNOSIS — J101 Influenza due to other identified influenza virus with other respiratory manifestations: Secondary | ICD-10-CM | POA: Diagnosis present

## 2014-08-22 DIAGNOSIS — N179 Acute kidney failure, unspecified: Secondary | ICD-10-CM

## 2014-08-22 LAB — URINALYSIS, ROUTINE W REFLEX MICROSCOPIC
Bilirubin Urine: NEGATIVE
Glucose, UA: NEGATIVE mg/dL
Hgb urine dipstick: NEGATIVE
KETONES UR: NEGATIVE mg/dL
Leukocytes, UA: NEGATIVE
Nitrite: NEGATIVE
Protein, ur: NEGATIVE mg/dL
SPECIFIC GRAVITY, URINE: 1.024 (ref 1.005–1.030)
UROBILINOGEN UA: 0.2 mg/dL (ref 0.0–1.0)
pH: 5.5 (ref 5.0–8.0)

## 2014-08-22 LAB — COMPREHENSIVE METABOLIC PANEL
ALBUMIN: 4.2 g/dL (ref 3.5–5.2)
ALT: 53 U/L (ref 0–53)
AST: 36 U/L (ref 0–37)
Alkaline Phosphatase: 36 U/L — ABNORMAL LOW (ref 39–117)
Anion gap: 10 (ref 5–15)
BUN: 9 mg/dL (ref 6–23)
CHLORIDE: 103 mmol/L (ref 96–112)
CO2: 22 mmol/L (ref 19–32)
CREATININE: 1.01 mg/dL (ref 0.50–1.35)
Calcium: 9.2 mg/dL (ref 8.4–10.5)
Glucose, Bld: 162 mg/dL — ABNORMAL HIGH (ref 70–99)
Potassium: 4.3 mmol/L (ref 3.5–5.1)
Sodium: 135 mmol/L (ref 135–145)
TOTAL PROTEIN: 8.4 g/dL — AB (ref 6.0–8.3)
Total Bilirubin: 0.2 mg/dL — ABNORMAL LOW (ref 0.3–1.2)

## 2014-08-22 LAB — RAPID URINE DRUG SCREEN, HOSP PERFORMED
Amphetamines: NOT DETECTED
Barbiturates: NOT DETECTED
Benzodiazepines: NOT DETECTED
COCAINE: NOT DETECTED
Opiates: NOT DETECTED
TETRAHYDROCANNABINOL: NOT DETECTED

## 2014-08-22 LAB — INFLUENZA PANEL BY PCR (TYPE A & B)
H1N1 flu by pcr: NOT DETECTED
INFLAPCR: POSITIVE — AB
Influenza B By PCR: NEGATIVE

## 2014-08-22 LAB — ETHANOL: Alcohol, Ethyl (B): <5 mg/dL (ref 0–9)

## 2014-08-22 LAB — TROPONIN I
Troponin I: <0.03 ng/mL (ref <0.031)
Troponin I: <0.03 ng/mL (ref <0.031)

## 2014-08-22 LAB — GLUCOSE, CAPILLARY: GLUCOSE-CAPILLARY: 141 mg/dL — AB (ref 70–99)

## 2014-08-22 LAB — UREA NITROGEN, URINE: Urea Nitrogen, Ur: 490 mg/dL

## 2014-08-22 LAB — URINE CYTOLOGY ANCILLARY ONLY
Chlamydia: NEGATIVE
NEISSERIA GONORRHEA: NEGATIVE

## 2014-08-22 LAB — PROTIME-INR
INR: 1.07 (ref 0.00–1.49)
Prothrombin Time: 14 seconds (ref 11.6–15.2)

## 2014-08-22 LAB — CREATININE, URINE, RANDOM: CREATININE, URINE: 138.7 mg/dL

## 2014-08-22 LAB — CK: CK TOTAL: 738 U/L — AB (ref 7–232)

## 2014-08-22 MED ORDER — OSELTAMIVIR PHOSPHATE 75 MG PO CAPS
75.0000 mg | ORAL_CAPSULE | Freq: Two times a day (BID) | ORAL | Status: DC
  Administered 2014-08-22 – 2014-08-23 (×3): 75 mg via ORAL
  Filled 2014-08-22 (×3): qty 1

## 2014-08-22 MED ORDER — IPRATROPIUM BROMIDE 0.02 % IN SOLN
0.5000 mg | Freq: Two times a day (BID) | RESPIRATORY_TRACT | Status: DC
  Administered 2014-08-23: 0.5 mg via RESPIRATORY_TRACT
  Filled 2014-08-22: qty 2.5

## 2014-08-22 NOTE — Procedures (Signed)
Talked to pt about cpap and settings but at this time pt refuses cpap. Will notify RT if his changes his mind.

## 2014-08-22 NOTE — Progress Notes (Signed)
[facility] PROGRESS NOTE  [name] VWU:[id number] DOB: 07/07/1986 DOA: 08/21/2014 PCP: Jeanann LewandowskyJEGEDE, OLUGBEMIGA, MD  Assessment/Plan: #1 influenza A Patient with some clinical improvement. Patient still complaining of cough and sore throat. No emesis. Will place patient on Tamiflu. IV fluids. Supportive care.  #2 acute renal failure Likely secondary to prerenal azotemia, in the setting of NSAIDs. Urinalysis nitrite negative leukocytes negative. Renal function improving with hydration. GC/ chlamydia probe negative. Renal ultrasound negative for hydronephrosis. Continue hydration. Follow.  #3 obstructive sleep apnea Sleep apnea.  #4 tobacco abuse Nicotine patch.  #5: History of alcohol Abuse Continue CIWA protocol.  #6 prophylaxis Heparin for DVT prophylaxis.  Code Status: Full Family Communication: Updated patient and wife would bedside. Disposition Plan: Home when medically stable 1-2 days.   Consultants:  None  Procedures:  Renal US 08/22/14  CT angio chest 08/21/14  CXR 08/21/14  Antibiotics:  None  HPI/Subjective: Patient states he feeling better. Patient states chest tightness has improved. Patient complaining of cough and sore throat.  Objective: Filed Vitals:   08/22/14 0647  BP: 151/80  Pulse: 91  Temp: 97.9 F (36.6 C)  Resp: 20    Intake/Output Summary (Last 24 hours) at 08/22/14 1110 Last data filed at 08/22/14 0904  Gross per 24 hour  Intake      0 ml  Output   1200 ml  Net  -1200 ml   Filed Weights   08/21/14 2217  Weight: 153.3 kg (337 lb 15.4 oz)    Exam:   General:  NAD  Cardiovascular: RRR  Respiratory: CTAB anterior lung fields.  Abdomen: Soft/NT/ND/+BS  Musculoskeletal: No c/c/e  Data Reviewed: Basic Metabolic Panel:  Recent Labs Lab 08/21/14 1822 08/22/14 0517  NA 136 135  K 3.8 4.3  CL 104 103  CO2 26 22  GLUCOSE 90 162*  BUN 10 9  CREATININE 1.39* 1.01  CALCIUM 9.5 9.2   Liver Function  Tests:  Recent Labs Lab 08/22/14 0517  AST 36  ALT 53  ALKPHOS 36*  BILITOT 0.2*  PROT 8.4*  ALBUMIN 4.2   No results for input(s): LIPASE, AMYLASE in the last 168 hours. No results for input(s): AMMONIA in the last 168 hours. CBC:  Recent Labs Lab 08/21/14 1822  WBC 9.3  NEUTROABS 6.0  HGB 14.6  HCT 44.5  MCV 83.8  PLT 363   Cardiac Enzymes:  Recent Labs Lab 08/21/14 2048 08/22/14 0006 08/22/14 0517  CKTOTAL  --  738*  --   TROPONINI <0.03 <0.03 <0.03   BNP (last 3 results)  Recent Labs  08/21/14 1822  BNP 24.5    ProBNP (last 3 results) No results for input(s): PROBNP in the last 8760 hours.  CBG:  Recent Labs Lab 08/22/14 0716  GLUCAP 141*    No results found for this or any previous visit (from the past 240 hour(s)).   Studies: Dg Chest 2 View  08/21/2014   CLINICAL DATA:  Cough and chest pain for 2 days. Shortness of breath. Dizziness. Asthma.  EXAM: CHEST  2 VIEW  COMPARISON:  01/28/2013  FINDINGS: The heart size and mediastinal contours are within normal limits. Both lungs are clear. The visualized skeletal structures are unremarkable.  IMPRESSION: Negative.  No active cardiopulmonary disease.   Electronically Signed   By: [name]  Stahl M.D.   On: 08/21/2014 17:49   Ct Angio Chest Pe W/cm &/or Wo Cm  08/21/2014   CLINICAL DATA:  Shortness of Breath for 2 days  EXAM: CT  ANGIOGRAPHY CHEST WITH CONTRAST  TECHNIQUE: Multidetector CT imaging of the chest was performed using the standard protocol during bolus administration of intravenous contrast. Multiplanar CT image reconstructions and MIPs were obtained to evaluate the vascular anatomy.  CONTRAST:  OMNIPAQUE IOHEXOL 350 MG/ML SOLN  COMPARISON:  Chest radiograph August 21, 2014  FINDINGS: There is no demonstrable pulmonary embolus. There is no thoracic aortic aneurysm or dissection.  There is mild dependent type atelectasis in the lung bases. There is no lung edema or consolidation. There is no  appreciable thoracic adenopathy. The pericardium is not thickened.  Visualized upper abdominal structures appear normal. Thyroid appears unremarkable. There are no blastic or lytic bone lesions.  Review of the MIP images confirms the above findings.  IMPRESSION: No demonstrable pulmonary embolus. Mild bibasilar atelectatic type change. No edema or consolidation.   Electronically Signed   By: [name] M.D.   On: 08/21/2014 20:09   US Renal  08/22/2014   CLINICAL DATA:  Increased creatinine.  EXAM: RENAL/URINARY TRACT ULTRASOUND COMPLETE  COMPARISON:  None.  FINDINGS: Right Kidney:  Length: 12.7 cm. Echogenicity within normal limits. No mass or hydronephrosis visualized.  Left Kidney:  Length: 13.8 cm. Echogenicity within normal limits. No mass or hydronephrosis visualized.  Bladder:  Appears normal for degree of bladder distention.  IMPRESSION: Normal exam.   Electronically Signed   By: Maisie Fus  Register   On: 08/22/2014 08:55    Scheduled Meds: . aspirin EC  81 mg Oral Daily  . dextromethorphan-guaiFENesin  1 tablet Oral BID  . folic acid  1 mg Oral Daily  . heparin  5,000 Units Subcutaneous 3 times per day  . ipratropium  0.5 mg Nebulization Q6H  . loratadine  10 mg Oral Daily  . LORazepam  0-4 mg Intravenous Q6H   Followed by  . [START ON 08/23/2014] LORazepam  0-4 mg Intravenous Q12H  . multivitamin with minerals  1 tablet Oral Daily  . nicotine  14 mg Transdermal Daily  . oseltamivir  75 mg Oral BID  . sodium chloride  3 mL Intravenous Q12H  . thiamine  100 mg Oral Daily   Or  . thiamine  100 mg Intravenous Daily   Continuous Infusions: . sodium chloride 150 mL/hr at 08/22/14 0709    Principal Problem:   Influenza A Active Problems:   Tobacco abuse   OSA on CPAP   Dehydration   Acute renal failure   Asthma   SOB (shortness of breath)   Cough    Time spent: 35 mins    [facility] MD [facility] Pager [phone]. If 7PM-7AM, please contact  night-coverage at [email or web address], password [facility] 08/22/2014, 11:10 AM

## 2014-08-22 NOTE — Progress Notes (Signed)
CARE MANAGEMENT NOTE 08/22/2014  Patient:  [name],[name]   Account Number:  [id number]  Date Initiated:  08/22/2014  Documentation initiated by:  Lanier ClamMAHABIR,Gracynn Rajewski  Subjective/Objective Assessment:   29 y/o m admitted w/sob,cough.WU:JWJXHx:etoh.     Action/Plan:   From home.PCP-[facility].   Anticipated DC Date:  08/23/2014   Anticipated DC Plan:  HOME/SELF CARE      DC Planning Services  CM consult      Choice offered to / List presented to:             Status of service:  In process, will continue to follow Medicare Important Message given?   (If response is "NO", the following Medicare IM given date fields will be blank) Date Medicare IM given:   Medicare IM given by:   Date Additional Medicare IM given:   Additional Medicare IM given by:    Discharge Disposition:    Per UR Regulation:  Reviewed for med. necessity/level of care/duration of stay  If discussed at Long Length of Stay Meetings, dates discussed:    Comments:  08/22/14 Lanier ClamKathy Stina Gane RN BSN NCM [phone] Monitor progress for d/c needs.

## 2014-08-23 LAB — CBC
HCT: 42.1 % (ref 39.0–52.0)
Hemoglobin: 13.5 g/dL (ref 13.0–17.0)
MCH: 26.9 pg (ref 26.0–34.0)
MCHC: 32.1 g/dL (ref 30.0–36.0)
MCV: 83.9 fL (ref 78.0–100.0)
PLATELETS: 347 10*3/uL (ref 150–400)
RBC: 5.02 MIL/uL (ref 4.22–5.81)
RDW: 13.9 % (ref 11.5–15.5)
WBC: 11.2 10*3/uL — AB (ref 4.0–10.5)

## 2014-08-23 LAB — URINE CULTURE
Colony Count: NO GROWTH
Culture: NO GROWTH

## 2014-08-23 LAB — BASIC METABOLIC PANEL
ANION GAP: 6 (ref 5–15)
BUN: 13 mg/dL (ref 6–23)
CALCIUM: 8.8 mg/dL (ref 8.4–10.5)
CO2: 25 mmol/L (ref 19–32)
CREATININE: 0.9 mg/dL (ref 0.50–1.35)
Chloride: 108 mmol/L (ref 96–112)
GFR calc Af Amer: >90 mL/min (ref >90)
GFR calc non Af Amer: >90 mL/min (ref >90)
GLUCOSE: 120 mg/dL — AB (ref 70–99)
Potassium: 4 mmol/L (ref 3.5–5.1)
SODIUM: 139 mmol/L (ref 135–145)

## 2014-08-23 LAB — GLUCOSE, CAPILLARY: Glucose-Capillary: 92 mg/dL (ref 70–99)

## 2014-08-23 LAB — HIV ANTIBODY (ROUTINE TESTING W REFLEX): HIV Screen 4th Generation wRfx: NONREACTIVE

## 2014-08-23 MED ORDER — DM-GUAIFENESIN ER 30-600 MG PO TB12
2.0000 | ORAL_TABLET | Freq: Two times a day (BID) | ORAL | Status: DC
  Administered 2014-08-23: 2 via ORAL
  Filled 2014-08-23: qty 2

## 2014-08-23 MED ORDER — OSELTAMIVIR PHOSPHATE 75 MG PO CAPS: 75.0000 mg | ORAL_CAPSULE | Freq: Two times a day (BID) | ORAL | Status: AC

## 2014-08-23 MED ORDER — LORATADINE-PSEUDOEPHEDRINE ER 5-120 MG PO TB12
1.0000 | ORAL_TABLET | Freq: Two times a day (BID) | ORAL | Status: AC
Start: 2014-08-23 — End: ?

## 2014-08-23 MED ORDER — BENZONATATE 200 MG PO CAPS: 200.0000 mg | ORAL_CAPSULE | Freq: Three times a day (TID) | ORAL | Status: AC

## 2014-08-23 MED ORDER — ALBUTEROL SULFATE HFA 108 (90 BASE) MCG/ACT IN AERS: 2.0000 | INHALATION_SPRAY | Freq: Four times a day (QID) | RESPIRATORY_TRACT | Status: AC | PRN

## 2014-08-23 NOTE — Care Management Note (Signed)
    Page 1 of 1   08/23/2014     12:31:01 PM CARE MANAGEMENT NOTE 08/23/2014  Patient:  [name],[name]   Account Number:  [id number]  Date Initiated:  08/22/2014  Documentation initiated by:  [name],[name]  Subjective/Objective Assessment:   29 y/o m admitted w/sob,cough.AY:TKZSHx:etoh.     Action/Plan:   From home.PCP-[facility].   Anticipated DC Date:  08/23/2014   Anticipated DC Plan:  HOME/SELF CARE      DC Planning Services  CM consult  [facility]      Choice offered to / List presented to:             Status of service:  Completed, signed off Medicare Important Message given?   (If response is "NO", the following Medicare IM given date fields will be blank) Date Medicare IM given:   Medicare IM given by:   Date Additional Medicare IM given:   Additional Medicare IM given by:    Discharge Disposition:  HOME/SELF CARE  Per UR Regulation:  Reviewed for med. necessity/level of care/duration of stay  If discussed at Long Length of Stay Meetings, dates discussed:    Comments:  08/23/14 Dakota ClamKathy Corrisa Gibby RN BSN NCM [phone] [facility] appt set by secy.Resources given.No further d/c needs.  08/22/14 Dakota ClamKathy Birdie Beveridge RN BSN NCM [phone] Monitor progress for d/c needs.

## 2014-08-23 NOTE — Discharge Summary (Signed)
Physician Discharge Summary  [name] ZOX:[id number] DOB: 11-25-1985 DOA: 08/21/2014  PCP: [name], MD  Admit date: 08/21/2014 Discharge date: 08/23/2014  Time spent: 65 minutes  Recommendations for Outpatient Follow-up:  1. Follow-up with [name], MD in 1 week. On follow-up patient in need a basic metabolic profile done to follow-up on electrolytes and renal function. Patient's influenza will need to be reassessed at that time.  Discharge Diagnoses:  Principal Problem:   Influenza A Active Problems:   Tobacco abuse   OSA on CPAP   Dehydration   Acute renal failure   Asthma   SOB (shortness of breath)   Cough   Discharge Condition: Stable and improved  Diet recommendation: Regular  Filed Weights   08/21/14 2217  Weight: 153.3 kg (337 lb 15.4 oz)    History of present illness:  [name] is a 29 y.o. male with PMH of asthma, OSA on CPAP, tobacco abuse, alcohol abuse, who presented with cough, shortness breath, chest tightness.  Patient reported that he started coughing 2 days prior to admission. He also had mild shortness of breath, headache, dizziness, sore throat, but no runny nose, fever or chills. He coughed up some clear mucus. No sick contacts. He reported having mild chest tightness, but no true chest pain. No pleuritic chest pain. No pain over the calf area. Patient reported that he had burning on urination for 3 days in last week, which had resolved already. He continued to smoke 2 cigars/ cigarettes daily. He drinks very little, last drink was 2 month ago.  Patient denied fever, chills, abdominal pain, diarrhea, constipation, hematuria, skin rashes, joint pain or leg swelling. No unilateral weakness, numbness or tingling sensations. No vision change or hearing loss.  In ED, patient was found to have tachycardia, WBC=9.3, BNP 24.5, AKI, normal temperature. Chest x-ray was negative for acute abnormalities. CT angiogram of chest was  negative for PE and pneumonia. Patient was admitted to inpatient for further evaluation and treatment.   Hospital Course:  #1 influenza A Patient was admitted with respiratory symptoms including cough, mild shortness of breath, dizziness, sore throat, headache and some chest tightness. Patient had a chest x-ray done which was negative for any acute abnormalities. CT angiogram of the chest which was done was negative for PE or pneumonia. An influenza PCR was obtained and patient was placed on nebulizer treatments as well as Mucinex and IV fluids and supportive care. Influenza panel came back positive for influenza A. Patient was subsequently placed on Tamiflu. Patient improved clinically and will be discharged home in stable and improved condition with 4 more days of Tamiflu. Patient is to follow-up with PCP as outpatient. Patient will be discharged in stable and improved condition.  #2 acute renal failure Likely secondary to prerenal azotemia, in the setting of NSAIDs. Urinalysis nitrite negative leukocytes negative. Renal function improved with hydration. GC/ chlamydia probe negative. Renal ultrasound negative for hydronephrosis. Resolved by day of discharge.   #3 obstructive sleep apnea Stable. Patient was offered CPAP during the hospitalization.  #4 tobacco abuse Patient was placed on a Nicotine patch.  #5: History of alcohol Abuse Patient was placed on the CIWA protocol. Patient remained stable throughout the hospitalization.   Procedures:  Renal US 08/22/14  CT angio chest 08/21/14  CXR 08/21/14    Consultations:  None  Discharge Exam: Filed Vitals:   08/23/14 0600  BP: 126/81  Pulse: 91  Temp: 97.7 F (36.5 C)  Resp: 21    General: NAD  Cardiovascular: RRR Respiratory: CTAB  Discharge Instructions   Discharge Instructions    Diet general    Complete by:  As directed      Discharge instructions    Complete by:  As directed   Follow up with JEGEDE, OLUGBEMIGA,  MD in 1 week.     Increase activity slowly    Complete by:  As directed           Current Discharge Medication List    START taking these medications   Details  albuterol (PROVENTIL HFA;VENTOLIN HFA) 108 (90 BASE) MCG/ACT inhaler Inhale 2 puffs into the lungs every 6 (six) hours as needed for wheezing or shortness of breath. 2 puffs 3 times daily x 5 days then, every 6 hours as needed. Qty: 18 Inhaler, Refills: 0    loratadine-pseudoephedrine (CLARITIN-D 12 HOUR) 5-120 MG per tablet Take 1 tablet by mouth 2 (two) times daily. Take for 5 days then stop. Qty: 20 tablet, Refills: 0    oseltamivir (TAMIFLU) 75 MG capsule Take 1 capsule (75 mg total) by mouth 2 (two) times daily. Take for 4 days then stop. Qty: 8 capsule, Refills: 0      CONTINUE these medications which have CHANGED   Details  benzonatate (TESSALON) 200 MG capsule Take 1 capsule (200 mg total) by mouth 3 (three) times daily. Take for 5 days then stop. Qty: 15 capsule, Refills: 0      CONTINUE these medications which have NOT CHANGED   Details  acetaminophen (TYLENOL) 500 MG tablet Take 1,000 mg by mouth every 6 (six) hours as needed for moderate pain or headache.    cetirizine (ZYRTEC) 10 MG tablet Take 10 mg by mouth daily as needed for allergies.     !! ibuprofen (ADVIL,MOTRIN) 200 MG tablet Take 400 mg by mouth every 6 (six) hours as needed for headache or moderate pain.    !! ibuprofen (ADVIL,MOTRIN) 800 MG tablet Take 1 tablet (800 mg total) by mouth 3 (three) times daily. Qty: 21 tablet, Refills: 0     !! - Potential duplicate medications found. Please discuss with provider.    STOP taking these medications     naproxen sodium (ANAPROX) 220 MG tablet      dextromethorphan-guaiFENesin (MUCINEX DM) 30-600 MG per 12 hr tablet        No Known Allergies Follow-up Information    Follow up with JEGEDE, OLUGBEMIGA, MD. Schedule an appointment as soon as possible for a visit in 1 week.   Specialty:   Internal Medicine   Contact information:   [address] [place] [address] [phone]        The results of significant diagnostics from this hospitalization (including imaging, microbiology, ancillary and laboratory) are listed below for reference.    Significant Diagnostic Studies: Dg Chest 2 View  08/21/2014   CLINICAL DATA:  Cough and chest pain for 2 days. Shortness of breath. Dizziness. Asthma.  EXAM: CHEST  2 VIEW  COMPARISON:  01/28/2013  FINDINGS: The heart size and mediastinal contours are within normal limits. Both lungs are clear. The visualized skeletal structures are unremarkable.  IMPRESSION: Negative.  No active cardiopulmonary disease.   Electronically Signed   By: [name]  Stahl M.D.   On: 08/21/2014 17:49   Ct Angio Chest Pe W/cm &/or Wo Cm  08/21/2014   CLINICAL DATA:  Shortness of Breath for 2 days  EXAM: CT ANGIOGRAPHY CHEST WITH CONTRAST  TECHNIQUE: Multidetector CT imaging of the chest was performed  using the standard protocol during bolus administration of intravenous contrast. Multiplanar CT image reconstructions and MIPs were obtained to evaluate the vascular anatomy.  CONTRAST:  OMNIPAQUE IOHEXOL 350 MG/ML SOLN  COMPARISON:  Chest radiograph August 21, 2014  FINDINGS: There is no demonstrable pulmonary embolus. There is no thoracic aortic aneurysm or dissection.  There is mild dependent type atelectasis in the lung bases. There is no lung edema or consolidation. There is no appreciable thoracic adenopathy. The pericardium is not thickened.  Visualized upper abdominal structures appear normal. Thyroid appears unremarkable. There are no blastic or lytic bone lesions.  Review of the MIP images confirms the above findings.  IMPRESSION: No demonstrable pulmonary embolus. Mild bibasilar atelectatic type change. No edema or consolidation.   Electronically Signed   By: [name] M.D.   On: 08/21/2014 20:09   US Renal  08/22/2014   CLINICAL DATA:   Increased creatinine.  EXAM: RENAL/URINARY TRACT ULTRASOUND COMPLETE  COMPARISON:  None.  FINDINGS: Right Kidney:  Length: 12.7 cm. Echogenicity within normal limits. No mass or hydronephrosis visualized.  Left Kidney:  Length: 13.8 cm. Echogenicity within normal limits. No mass or hydronephrosis visualized.  Bladder:  Appears normal for degree of bladder distention.  IMPRESSION: Normal exam.   Electronically Signed   By: Maisie Fus  Register   On: 08/22/2014 08:55    Microbiology: Recent Results (from the past 240 hour(s))  Urine culture     Status: None   Collection Time: 08/22/14  3:27 AM  Result Value Ref Range Status   Specimen Description URINE, RANDOM  Final   Special Requests NONE  Final   Colony Count NO GROWTH Performed at [facility]   Final   Culture NO GROWTH Performed at [facility]   Final   Report Status 08/23/2014 FINAL  Final     Labs: Basic Metabolic Panel:  Recent Labs Lab 08/21/14 1822 08/22/14 0517 08/23/14 0443  NA 136 135 139  K 3.8 4.3 4.0  CL 104 103 108  CO2 GLUCOSE 90 162* 120*  BUN CREATININE 1.39* 1.01 0.90  CALCIUM 9.5 9.2 8.8   Liver Function Tests:  Recent Labs Lab 08/22/14 0517  AST 36  ALT 53  ALKPHOS 36*  BILITOT 0.2*  PROT 8.4*  ALBUMIN 4.2   No results for input(s): LIPASE, AMYLASE in the last 168 hours. No results for input(s): AMMONIA in the last 168 hours. CBC:  Recent Labs Lab 08/21/14 1822 08/23/14 0443  WBC 9.3 11.2*  NEUTROABS 6.0  --   HGB 14.6 13.5  HCT 44.5 42.1  MCV 83.8 83.9  PLT 363 347   Cardiac Enzymes:  Recent Labs Lab 08/21/14 2048 08/22/14 0006 08/22/14 0517 08/22/14 1155  CKTOTAL  --  738*  --   --   TROPONINI <0.03 <0.03 <0.03 <0.03   BNP: BNP (last 3 results)  Recent Labs  08/21/14 1822  BNP 24.5    ProBNP (last 3 results) No results for input(s): PROBNP in the last 8760 hours.  CBG:  Recent Labs Lab 08/22/14 0716 08/23/14 0729  GLUCAP  141* 92       Signed:  Juri Dinning MD [facility] 08/23/2014, 10:57 AM

## 2014-08-23 NOTE — Progress Notes (Signed)
D/C instructions reviewed w/ pt and SO. Both verbalize understanding and all questions answered. Pt d/c in w/c by NT in stable condition to SO's car. Pt in possession of d/c instructions, scripts, note for work, and all personal belongings.

## 2014-09-05 ENCOUNTER — Inpatient Hospital Stay: Payer: 59 | Admitting: Internal Medicine

## 2015-11-14 ENCOUNTER — Encounter (HOSPITAL_COMMUNITY): Payer: Self-pay | Admitting: *Deleted

## 2015-11-14 ENCOUNTER — Emergency Department (HOSPITAL_COMMUNITY)
Admission: EM | Admit: 2015-11-14 | Discharge: 2015-11-14 | Disposition: A | Discharge status: Home / Self Care | Payer: 59 | Attending: Emergency Medicine | Admitting: Emergency Medicine

## 2015-11-14 DIAGNOSIS — J0100 Acute maxillary sinusitis, unspecified: Secondary | ICD-10-CM

## 2015-11-14 DIAGNOSIS — R42 Dizziness and giddiness: Secondary | ICD-10-CM | POA: Insufficient documentation

## 2015-11-14 DIAGNOSIS — Z79899 Other long term (current) drug therapy: Secondary | ICD-10-CM | POA: Insufficient documentation

## 2015-11-14 DIAGNOSIS — H66001 Acute suppurative otitis media without spontaneous rupture of ear drum, right ear: Secondary | ICD-10-CM | POA: Insufficient documentation

## 2015-11-14 DIAGNOSIS — J069 Acute upper respiratory infection, unspecified: Secondary | ICD-10-CM

## 2015-11-14 DIAGNOSIS — J45909 Unspecified asthma, uncomplicated: Secondary | ICD-10-CM | POA: Insufficient documentation

## 2015-11-14 DIAGNOSIS — F17210 Nicotine dependence, cigarettes, uncomplicated: Secondary | ICD-10-CM | POA: Insufficient documentation

## 2015-11-14 LAB — CBC
HCT: 46.3 % (ref 39.0–52.0)
HEMOGLOBIN: 15 g/dL (ref 13.0–17.0)
MCH: 26.6 pg (ref 26.0–34.0)
MCHC: 32.4 g/dL (ref 30.0–36.0)
MCV: 82.2 fL (ref 78.0–100.0)
Platelets: 378 10*3/uL (ref 150–400)
RBC: 5.63 MIL/uL (ref 4.22–5.81)
RDW: 13.7 % (ref 11.5–15.5)
WBC: 6.4 10*3/uL (ref 4.0–10.5)

## 2015-11-14 LAB — BASIC METABOLIC PANEL
ANION GAP: 7 (ref 5–15)
BUN: 11 mg/dL (ref 6–20)
CO2: 25 mmol/L (ref 22–32)
CREATININE: 0.96 mg/dL (ref 0.61–1.24)
Calcium: 9.2 mg/dL (ref 8.9–10.3)
Chloride: 107 mmol/L (ref 101–111)
GFR calc Af Amer: >60 mL/min (ref >60)
GLUCOSE: 109 mg/dL — AB (ref 65–99)
Potassium: 4 mmol/L (ref 3.5–5.1)
Sodium: 139 mmol/L (ref 135–145)

## 2015-11-14 MED ORDER — AMOXICILLIN 500 MG PO CAPS: 500.0000 mg | ORAL_CAPSULE | Freq: Three times a day (TID) | ORAL | Status: AC

## 2015-11-14 MED ORDER — FLUTICASONE PROPIONATE 50 MCG/ACT NA SUSP: 2.0000 | Freq: Every day | NASAL | Status: AC

## 2015-11-14 NOTE — Discharge Instructions (Signed)
Continue to stay well-hydrated. Continue to alternate between Tylenol and Ibuprofen for pain or fever. Use Mucinex for cough suppression/expectoration of mucus. Use netipot and flonase to help with nasal congestion. May consider over-the-counter Benadryl or other antihistamine (like xyzal, claritin, zyrtec, etc) to decrease secretions and for watery itchy eyes. Take amoxicillin as directed for your ear infection. Followup with [facility] in 5-7 days for recheck of ongoing symptoms and to establish medical care. Return to emergency department for emergent changing or worsening of symptoms.   Otitis Media, Adult Otitis media is redness, soreness, and inflammation of the middle ear. Otitis media may be caused by allergies or, most commonly, by infection. Often it occurs as a complication of the common cold. SIGNS AND SYMPTOMS Symptoms of otitis media may include:  Earache.  Fever.  Ringing in your ear.  Headache.  Leakage of fluid from the ear. DIAGNOSIS To diagnose otitis media, your health care provider will examine your ear with an otoscope. This is an instrument that allows your health care provider to see into your ear in order to examine your eardrum. Your health care provider also will ask you questions about your symptoms. TREATMENT  Typically, otitis media resolves on its own within 3-5 days. Your health care provider may prescribe medicine to ease your symptoms of pain. If otitis media does not resolve within 5 days or is recurrent, your health care provider may prescribe antibiotic medicines if he or she suspects that a bacterial infection is the cause. HOME CARE INSTRUCTIONS   If you were prescribed an antibiotic medicine, finish it all even if you start to feel better.  Take medicines only as directed by your health care provider.  Keep all follow-up visits as directed by your health care provider. SEEK MEDICAL CARE IF:  You have otitis media only in one ear, or  bleeding from your nose, or both.  You notice a lump on your neck.  You are not getting better in 3-5 days.  You feel worse instead of better. SEEK IMMEDIATE MEDICAL CARE IF:   You have pain that is not controlled with medicine.  You have swelling, redness, or pain around your ear or stiffness in your neck.  You notice that part of your face is paralyzed.  You notice that the bone behind your ear (mastoid) is tender when you touch it. MAKE SURE YOU:   Understand these instructions.  Will watch your condition.  Will get help right away if you are not doing well or get worse.   This information is not intended to replace advice given to you by your health care provider. Make sure you discuss any questions you have with your health care provider.   Document Released: 03/29/2004 Document Revised: 07/15/2014 Document Reviewed: 01/19/2013 Elsevier Interactive Patient Education 2016 [employer].  Near-Syncope Near-syncope (commonly known as near fainting) is sudden weakness, dizziness, or feeling like you might pass out. This can happen when getting up or while standing for a long time. It is caused by a sudden decrease in blood flow to the brain, which can occur for various reasons. Most of the reasons are not serious.  HOME CARE Watch your condition for any changes.  Have someone stay with you until you feel stable.  If you feel like you are going to pass out:  Lie down right away.  Prop your feet up if you can.  Breathe deeply and steadily.  Move only when the feeling has gone away. Most  of the time, this feeling lasts only a few minutes. You may feel tired for several hours.  Drink enough fluids to keep your pee (urine) clear or pale yellow.  If you are taking blood pressure or heart medicine, stand up slowly.  Follow up with your doctor as told. GET HELP RIGHT AWAY IF:   You have a severe headache.  You have unusual pain in the chest, belly (abdomen), or  back.  You have bleeding from the mouth or butt (rectum), or you have black or tarry poop (stool).  You feel your heart beat differently than normal, or you have a very fast pulse.  You pass out, or you twitch and shake when you pass out.  You pass out when sitting or lying down.  You feel confused.  You have trouble walking.  You are weak.  You have vision problems. MAKE SURE YOU:   Understand these instructions.  Will watch your condition.  Will get help right away if you are not doing well or get worse.   This information is not intended to replace advice given to you by your health care provider. Make sure you discuss any questions you have with your health care provider.   Document Released: 12/11/2007 Document Revised: 07/15/2014 Document Reviewed: 11/27/2012 Elsevier Interactive Patient Education 2016 [employer]  Sinusitis, Adult Sinusitis is redness, soreness, and inflammation of the paranasal sinuses. Paranasal sinuses are air pockets within the bones of your face. They are located beneath your eyes, in the middle of your forehead, and above your eyes. In healthy paranasal sinuses, mucus is able to drain out, and air is able to circulate through them by way of your nose. However, when your paranasal sinuses are inflamed, mucus and air can become trapped. This can allow bacteria and other germs to grow and cause infection. Sinusitis can develop quickly and last only a short time (acute) or continue over a long period (chronic). Sinusitis that lasts for more than 12 weeks is considered chronic. CAUSES Causes of sinusitis include:  Allergies.  Structural abnormalities, such as displacement of the cartilage that separates your nostrils (deviated septum), which can decrease the air flow through your nose and sinuses and affect sinus drainage.  Functional abnormalities, such as when the small hairs (cilia) that line your sinuses and help remove mucus do not work  properly or are not present. SIGNS AND SYMPTOMS Symptoms of acute and chronic sinusitis are the same. The primary symptoms are pain and pressure around the affected sinuses. Other symptoms include:  Upper toothache.  Earache.  Headache.  Bad breath.  Decreased sense of smell and taste.  A cough, which worsens when you are lying flat.  Fatigue.  Fever.  Thick drainage from your nose, which often is green and may contain pus (purulent).  Swelling and warmth over the affected sinuses. DIAGNOSIS Your health care provider will perform a physical exam. During your exam, your health care provider may perform any of the following to help determine if you have acute sinusitis or chronic sinusitis:  Look in your nose for signs of abnormal growths in your nostrils (nasal polyps).  Tap over the affected sinus to check for signs of infection.  View the inside of your sinuses using an imaging device that has a light attached (endoscope). If your health care provider suspects that you have chronic sinusitis, one or more of the following tests may be recommended:  Allergy tests.  Nasal culture. A sample of mucus is taken from  your nose, sent to a lab, and screened for bacteria.  Nasal cytology. A sample of mucus is taken from your nose and examined by your health care provider to determine if your sinusitis is related to an allergy. TREATMENT Most cases of acute sinusitis are related to a viral infection and will resolve on their own within 10 days. Sometimes, medicines are prescribed to help relieve symptoms of both acute and chronic sinusitis. These may include pain medicines, decongestants, nasal steroid sprays, or saline sprays. However, for sinusitis related to a bacterial infection, your health care provider will prescribe antibiotic medicines. These are medicines that will help kill the bacteria causing the infection. Rarely, sinusitis is caused by a fungal infection. In these cases,  your health care provider will prescribe antifungal medicine. For some cases of chronic sinusitis, surgery is needed. Generally, these are cases in which sinusitis recurs more than 3 times per year, despite other treatments. HOME CARE INSTRUCTIONS  Drink plenty of water. Water helps thin the mucus so your sinuses can drain more easily.  Use a humidifier.  Inhale steam 3-4 times a day (for example, sit in the bathroom with the shower running).  Apply a warm, moist washcloth to your face 3-4 times a day, or as directed by your health care provider.  Use saline nasal sprays to help moisten and clean your sinuses.  Take medicines only as directed by your health care provider.  If you were prescribed either an antibiotic or antifungal medicine, finish it all even if you start to feel better. SEEK IMMEDIATE MEDICAL CARE IF:  You have increasing pain or severe headaches.  You have nausea, vomiting, or drowsiness.  You have swelling around your face.  You have vision problems.  You have a stiff neck.  You have difficulty breathing.   This information is not intended to replace advice given to you by your health care provider. Make sure you discuss any questions you have with your health care provider.   Document Released: 06/24/2005 Document Revised: 07/15/2014 Document Reviewed: 07/09/2011 Elsevier Interactive Patient Education 2016 [employer]  Sinus Rinse WHAT IS A SINUS RINSE? A sinus rinse is a simple home treatment that is used to rinse your sinuses with a sterile mixture of salt and water (saline solution). Sinuses are air-filled spaces in your skull behind the bones of your face and forehead that open into your nasal cavity. You will use the following:  Saline solution.  Neti pot or spray bottle. This releases the saline solution into your nose and through your sinuses. Neti pots and spray bottles can be purchased at [facility] local [facility], a health food store, or  online. WHEN WOULD I DO A SINUS RINSE? A sinus rinse can help to clear mucus, dirt, dust, or pollen from the nasal cavity. You may do a sinus rinse when you have a cold, a virus, nasal allergy symptoms, a sinus infection, or stuffiness in the nose or sinuses. If you are considering a sinus rinse:  Ask your child's health care provider before performing a sinus rinse on your child.  Do not do a sinus rinse if you have had ear or nasal surgery, ear infection, or blocked ears. HOW DO I DO A SINUS RINSE?  Wash your hands.  Disinfect your device according to the directions provided and then dry it.  Use the solution that comes with your device or one that is sold separately in stores. Follow the mixing directions on the package.  Fill your device  with the amount of saline solution as directed by the device instructions.  Stand over a sink and tilt your head sideways over the sink.  Place the spout of the device in your upper nostril (the one closer to the ceiling).  Gently pour or squeeze the saline solution into the nasal cavity. The liquid should drain to the lower nostril if you are not overly congested.  Gently blow your nose. Blowing too hard may cause ear pain.  Repeat in the other nostril.  Clean and rinse your device with clean water and then air-dry it. ARE THERE RISKS OF A SINUS RINSE?  Sinus rinse is generally very safe and effective. However, there are a few risks, which include:   A burning sensation in the sinuses. This may happen if you do not make the saline solution as directed. Make sure to follow all directions when making the saline solution.  Infection from contaminated water. This is rare, but possible.  Nasal irritation.   This information is not intended to replace advice given to you by your health care provider. Make sure you discuss any questions you have with your health care provider.   Document Released: 01/19/2014 Document Reviewed:  01/19/2014 Elsevier Interactive Patient Education 2016 [employer] Inc.  Upper Respiratory Infection, Adult Most upper respiratory infections (URIs) are caused by a virus. A URI affects the nose, throat, and upper air passages. The most common type of URI is often called "the common cold." HOME CARE   Take medicines only as told by your doctor.  Gargle warm saltwater or take cough drops to comfort your throat as told by your doctor.  Use a warm mist humidifier or inhale steam from a shower to increase air moisture. This may make it easier to breathe.  Drink enough fluid to keep your pee (urine) clear or pale yellow.  Eat soups and other clear broths.  Have a healthy diet.  Rest as needed.  Go back to work when your fever is gone or your doctor says it is okay.  You may need to stay home longer to avoid giving your URI to others.  You can also wear a face mask and wash your hands often to prevent spread of the virus.  Use your inhaler more if you have asthma.  Do not use any tobacco products, including cigarettes, chewing tobacco, or electronic cigarettes. If you need help quitting, ask your doctor. GET HELP IF:  You are getting worse, not better.  Your symptoms are not helped by medicine.  You have chills.  You are getting more short of breath.  You have brown or red mucus.  You have yellow or brown discharge from your nose.  You have pain in your face, especially when you bend forward.  You have a fever.  You have puffy (swollen) neck glands.  You have pain while swallowing.  You have white areas in the back of your throat. GET HELP RIGHT AWAY IF:   You have very bad or constant:  Headache.  Ear pain.  Pain in your forehead, behind your eyes, and over your cheekbones (sinus pain).  Chest pain.  You have long-lasting (chronic) lung disease and any of the following:  Wheezing.  Long-lasting cough.  Coughing up blood.  A change in your usual  mucus.  You have a stiff neck.  You have changes in your:  Vision.  Hearing.  Thinking.  Mood. MAKE SURE YOU:   Understand these instructions.  Will watch your condition.  Will get help right away if you are not doing well or get worse.   This information is not intended to replace advice given to you by your health care provider. Make sure you discuss any questions you have with your health care provider.   Document Released: 12/11/2007 Document Revised: 11/08/2014 Document Reviewed: 09/29/2013 Elsevier Interactive Patient Education [employer]

## 2015-11-14 NOTE — ED Notes (Signed)
Pt reports dizziness this am around 0800.  States h/a and feeling tired waking up this am prior to onset of dizziness.  Pt also has URI sxs.

## 2015-11-14 NOTE — ED Notes (Signed)
PA at bedside.

## 2015-11-14 NOTE — ED Provider Notes (Signed)
CSN: [id number]     Arrival date & time 11/14/15  7829 History   First MD Initiated Contact with Patient 11/14/15 1138     Chief Complaint  Patient presents with  . Dizziness  . URI     (Consider location/radiation/quality/duration/timing/severity/associated sxs/prior Treatment) HPI Comments: [name] is a 30 y.o. male with a PMHx of asthma and sleep apnea, who presents to the ED with complaints of URI symptoms 1.5 weeks including sinus congestion, clear rhinorrhea, and sinus/facial pain and fullness. He has been taking Tylenol with some relief. This morning he states he woke up somewhat sluggish and tired, got ready for work and was in the car around 8 AM when he felt lightheaded that worsens with head movement, and has noticed that his right ear feels full and he has had diminished hearing. He states he thought that his upper respiratory infection was from his allergies. Denies any sick contacts. He denies any fevers, chills, ear pain or drainage, sore throat, cough, chest pain, shortness breath, wheezing, vision changes, syncope, abdominal pain, nausea vomiting, diarrhea, constipation, dysuria, hematuria, numbness, tingling, or focal weakness.  Patient is a 30 y.o. male presenting with dizziness and URI. The history is provided by the patient. No language interpreter was used.  Dizziness Associated symptoms: headaches (frontal sinus pain) and hearing loss   Associated symptoms: no chest pain, no diarrhea, no nausea, no shortness of breath, no vomiting and no weakness   URI Presenting symptoms: congestion, facial pain and rhinorrhea   Presenting symptoms: no cough, no ear pain, no fever and no sore throat   Severity:  Moderate Onset quality:  Gradual Duration:  2 weeks Timing:  Constant Progression:  Unchanged Chronicity:  New Relieved by:  OTC medications Worsened by:  Nothing tried Ineffective treatments:  None tried Associated symptoms: headaches (frontal sinus pain)    Associated symptoms: no arthralgias, no myalgias and no wheezing   Risk factors: no immunosuppression and no sick contacts     Past Medical History  Diagnosis Date  . Sleep apnea   . Asthma    History reviewed. No pertinent past surgical history. Family History  Problem Relation Age of Onset  . Sarcoidosis Mother   . Hypertension Father    Social History  Substance Use Topics  . Smoking status: Current Every Day Smoker    Types: Cigars  . Smokeless tobacco: Never Used  . Alcohol Use: Yes     Comment: occasionally    Review of Systems  Constitutional: Negative for fever and chills.  HENT: Positive for congestion, hearing loss, rhinorrhea and sinus pressure. Negative for ear discharge, ear pain and sore throat.   Eyes: Negative for visual disturbance.  Respiratory: Negative for cough, shortness of breath and wheezing.   Cardiovascular: Negative for chest pain.  Gastrointestinal: Negative for nausea, vomiting, abdominal pain, diarrhea and constipation.  Genitourinary: Negative for dysuria and hematuria.  Musculoskeletal: Negative for myalgias and arthralgias.  Skin: Negative for color change.  Allergic/Immunologic: Positive for environmental allergies. Negative for immunocompromised state.  Neurological: Positive for dizziness and headaches (frontal sinus pain). Negative for syncope, weakness and numbness.  Psychiatric/Behavioral: Negative for confusion.   10 Systems reviewed and are negative for acute change except as noted in the HPI.    Allergies  Review of patient's allergies indicates no known allergies.  Home Medications   Prior to Admission medications   Medication Sig Start Date End Date Taking? Authorizing Provider  acetaminophen (TYLENOL) 500 MG tablet Take 1,000 mg by  mouth every 6 (six) hours as needed for moderate pain or headache.    Historical Provider, MD  albuterol (PROVENTIL HFA;VENTOLIN HFA) 108 (90 BASE) MCG/ACT inhaler Inhale 2 puffs into the lungs  every 6 (six) hours as needed for wheezing or shortness of breath. 2 puffs 3 times daily x 5 days then, every 6 hours as needed. 08/23/14   Rodolph Bonganiel Thompson V, MD  benzonatate (TESSALON) 200 MG capsule Take 1 capsule (200 mg total) by mouth 3 (three) times daily. Take for 5 days then stop. 08/23/14   Rodolph Bonganiel Thompson V, MD  cetirizine (ZYRTEC) 10 MG tablet Take 10 mg by mouth daily as needed for allergies.     Historical Provider, MD  ibuprofen (ADVIL,MOTRIN) 200 MG tablet Take 400 mg by mouth every 6 (six) hours as needed for headache or moderate pain.    Historical Provider, MD  ibuprofen (ADVIL,MOTRIN) 800 MG tablet Take 1 tablet (800 mg total) by mouth 3 (three) times daily. Patient not taking: Reported on 08/21/2014 11/03/13   Mellody DrownLauren Parker, PA-C  loratadine-pseudoephedrine (CLARITIN-D 12 HOUR) 5-120 MG per tablet Take 1 tablet by mouth 2 (two) times daily. Take for 5 days then stop. 08/23/14   Rodolph Bonganiel Thompson V, MD  oseltamivir (TAMIFLU) 75 MG capsule Take 1 capsule (75 mg total) by mouth 2 (two) times daily. Take for 4 days then stop. 08/23/14   Rodolph Bonganiel Thompson V, MD   BP 117/67 mmHg  Pulse 97  Temp(Src) 98.3 F (36.8 C) (Oral)  Resp 17  Ht 5\' 11"  (1.803 m)  Wt 140.615 kg  BMI 43.26 kg/m2  SpO2 95% Physical Exam  Constitutional: He is oriented to person, place, and time. Vital signs are normal. He appears well-developed and well-nourished.  Non-toxic appearance. No distress.  Afebrile, nontoxic, NAD  HENT:  Head: Normocephalic and atraumatic.  Right Ear: Hearing, external ear and ear canal normal. Tympanic membrane is erythematous and bulging.  Left Ear: Hearing, tympanic membrane, external ear and ear canal normal.  Nose: Mucosal edema and rhinorrhea present. Right sinus exhibits maxillary sinus tenderness. Right sinus exhibits no frontal sinus tenderness. Left sinus exhibits maxillary sinus tenderness. Left sinus exhibits no frontal sinus tenderness.  Mouth/Throat: Uvula is midline,  oropharynx is clear and moist and mucous membranes are normal. No trismus in the jaw. No uvula swelling.  R TM with bulging and erythematous, canal clear; L ear clear. Nose with mucosal edema and rhinorrhea. B/l maxillary sinuses TTP. Oropharynx clear and moist, without uvular swelling or deviation, no trismus or drooling, no tonsillar swelling or erythema, no exudates.    Eyes: Conjunctivae and EOM are normal. Pupils are equal, round, and reactive to light. Right eye exhibits no discharge. Left eye exhibits no discharge.  PERRL, EOMI, no nystagmus, no visual field deficits   Neck: Normal range of motion. Neck supple. No spinous process tenderness and no muscular tenderness present. No rigidity. Normal range of motion present.  FROM intact without spinous process TTP, no bony stepoffs or deformities, no paraspinous muscle TTP or muscle spasms. No rigidity or meningeal signs. No bruising or swelling.   Cardiovascular: Normal rate, regular rhythm, normal heart sounds and intact distal pulses.  Exam reveals no gallop and no friction rub.   No murmur heard. Pulmonary/Chest: Effort normal and breath sounds normal. No respiratory distress. He has no decreased breath sounds. He has no wheezes. He has no rhonchi. He has no rales.  Abdominal: Soft. Normal appearance and bowel sounds are normal. He exhibits no distension.  There is no tenderness. There is no rigidity, no rebound, no guarding, no CVA tenderness, no tenderness at McBurney's point and negative Murphy's sign.  Musculoskeletal: Normal range of motion.  MAE x4 Strength and sensation grossly intact Distal pulses intact Gait steady  Neurological: He is alert and oriented to person, place, and time. He has normal strength. No cranial nerve deficit or sensory deficit. Coordination and gait normal. GCS eye subscore is 4. GCS verbal subscore is 5. GCS motor subscore is 6.  CN 2-12 grossly intact A&O x4 GCS 15 Sensation and strength intact Gait  nonataxic Coordination with finger-to-nose WNL Neg pronator drift   Skin: Skin is warm, dry and intact. No rash noted.  Psychiatric: He has a normal mood and affect.  Nursing note and vitals reviewed.   ED Course  Procedures (including critical care time) Labs Review Labs Reviewed  BASIC METABOLIC PANEL - Abnormal; Notable for the following:    Glucose, Bld 109 (*)    All other components within normal limits  CBC  URINALYSIS, ROUTINE W REFLEX MICROSCOPIC (NOT AT [facility])    Imaging Review No results found. I have personally reviewed and evaluated these images and lab results as part of my medical decision-making.   EKG Interpretation None      MDM   Final diagnoses:  Acute suppurative otitis media of right ear without spontaneous rupture of tympanic membrane, recurrence not specified  Lightheadedness  URI (upper respiratory infection)  Acute maxillary sinusitis, recurrence not specified    30 y.o. male here with URI x1.5wks, R ear fullness/diminished hearing, and lightheadedness this morning on his way to work. Mild frontal maxillary pain and tenderness, R TM bulging and erythematous consistent with AOM. No focal neuro deficits, lightheadedness and facial pain likely from R otitis media and sinusitis. Labs obtained in triage and were unremarkable, doubt need for further work up at this time for his symptoms. Discussed OTC symptomatic control meds, flonase/netipot, and will start on amoxicillin for presumed bacterial AOM/sinusitis. F/up with [facility] in 1wk to establish care and recheck symptoms. I explained the diagnosis and have given explicit precautions to return to the ER including for any other new or worsening symptoms. The patient understands and accepts the medical plan as it's been dictated and I have answered their questions. Discharge instructions concerning home care and prescriptions have been given. The patient is STABLE and is discharged to home in good condition.   BP  131/91 mmHg  Pulse 90  Temp(Src) 97.7 F (36.5 C) (Oral)  Resp 16  Ht  (1.803 m)  Wt 140.615 kg  BMI 43.26 kg/m2  SpO2 96%  Meds ordered this encounter  Medications  . fluticasone (FLONASE) 50 MCG/ACT nasal spray    Sig: Place 2 sprays into both nostrils daily.    Dispense:  16 g    Refill:  0    Order Specific Question:  Supervising Provider    Answer:  MILLER, BRIAN [[id number]]  . amoxicillin (AMOXIL) 500 MG capsule    Sig: Take 1 capsule (500 mg total) by mouth 3 (three) times daily. x7 days    Dispense:  21 capsule    Refill:  0    Order Specific Question:  Supervising Provider    Answer:  Eber Hong [[id number]]     Lavell Supple Camprubi-Soms, PA-C 11/14/15 1205  Leta Baptist, MD 11/16/15 [phone]

## 2017-01-17 IMAGING — CT CT ANGIO CHEST
1 of 2 series · 19 of 32 positions shown · IV contrast (OMNIPAQUE 350)
Comparison: Chest radiograph August 21, 2014

CLINICAL DATA: Shortness of Breath for 2 days

EXAM:
CT ANGIOGRAPHY CHEST WITH CONTRAST
TECHNIQUE: Multidetector CT imaging of the chest was performed using the
standard protocol during bolus administration of intravenous
contrast. Multiplanar CT image reconstructions and MIPs were
obtained to evaluate the vascular anatomy.
CONTRAST:  100mL OMNIPAQUE IOHEXOL 350 MG/ML SOLN

[Series 6: thins for pacs · axial · 0.74mm/px · z∈[-38,+180]mm · 19 of 244 slices shown]
[im 13/244  lung]
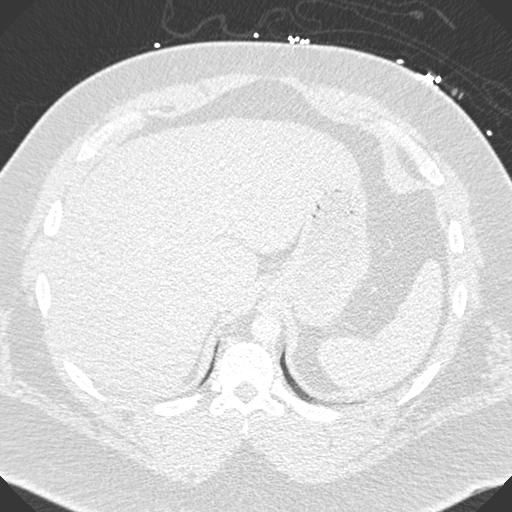
[im 25/244  mediastinal]
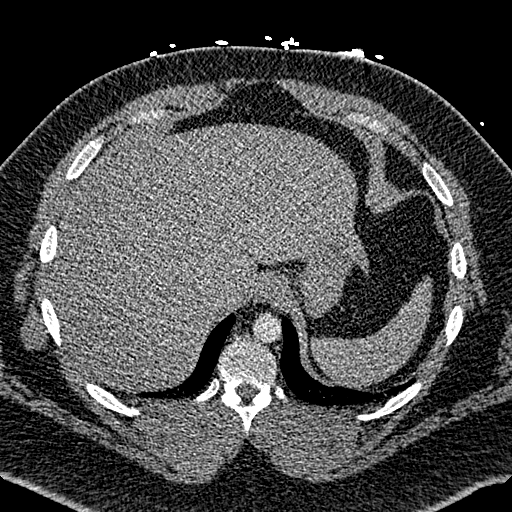
[im 37/244  lung]
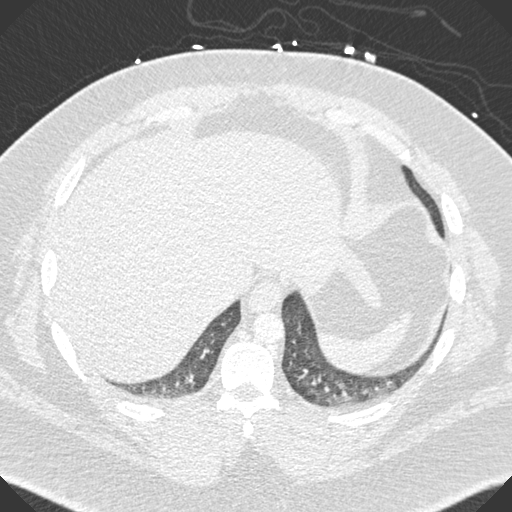
[im 61/244  mediastinal]
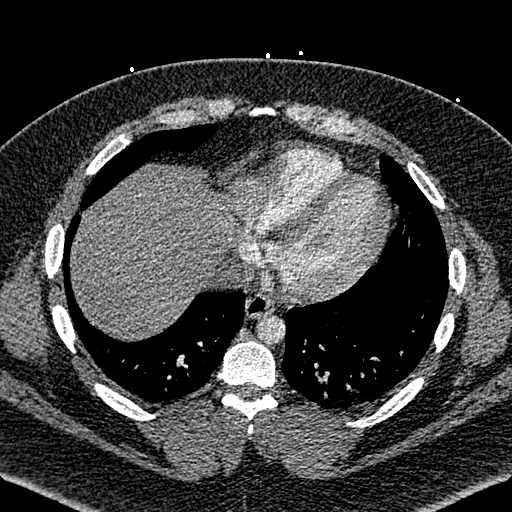
[im 73/244  lung]
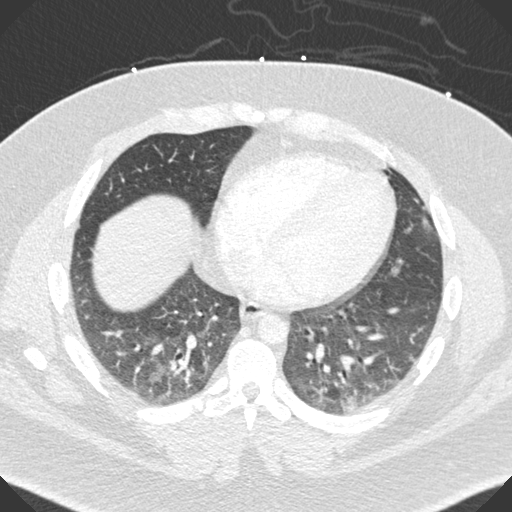
[im 82/244  mediastinal]
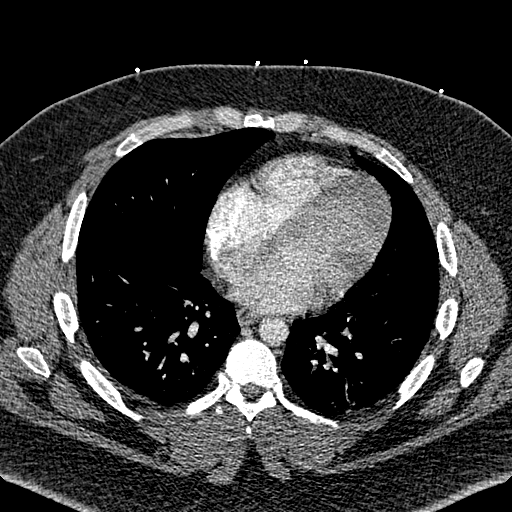
[im 86/244  lung]
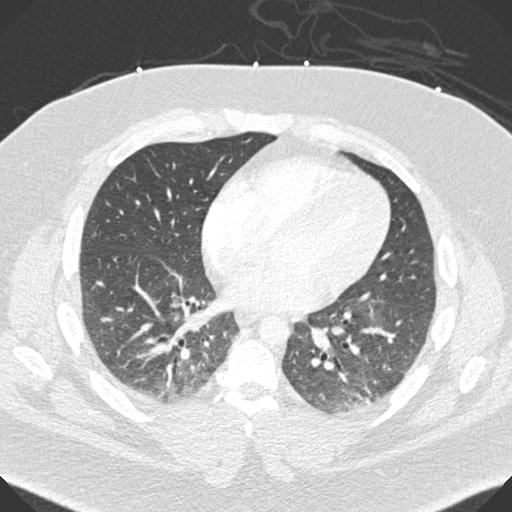
[im 98/244  mediastinal]
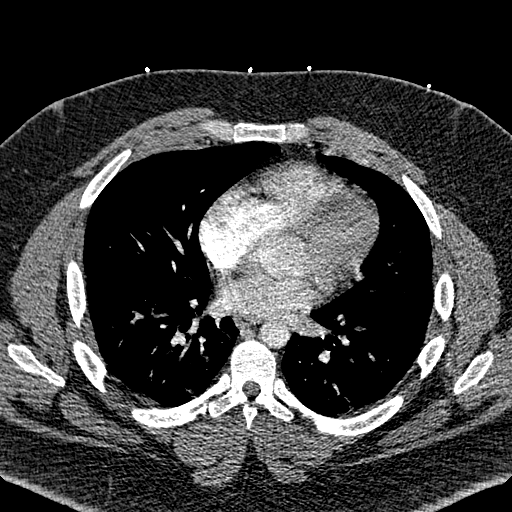
[im 110/244  lung]
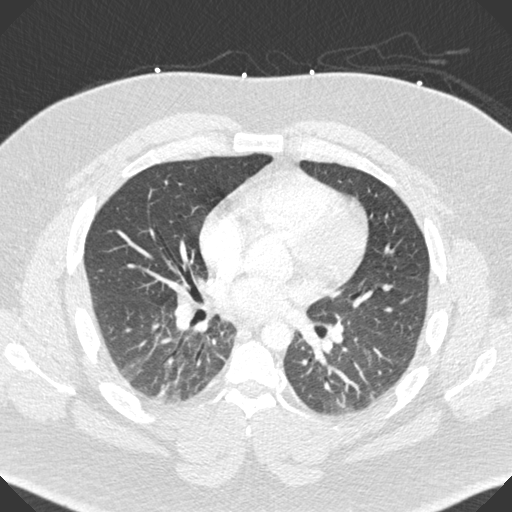
[im 122/244  mediastinal]
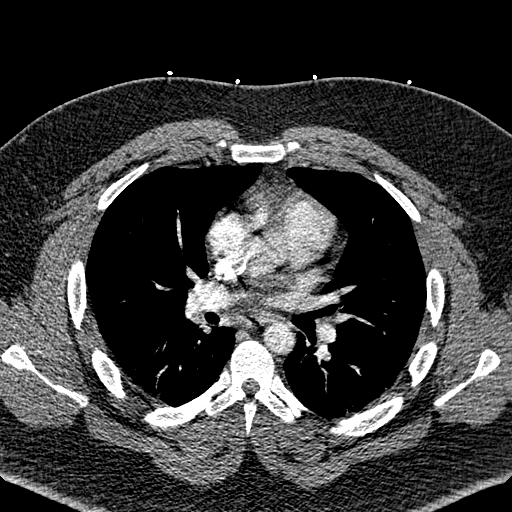
[im 134/244  lung]
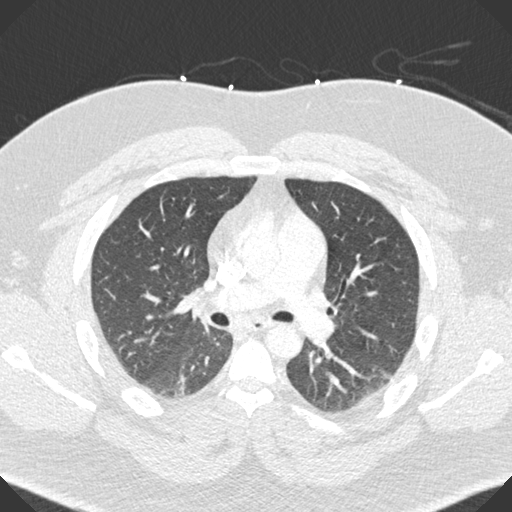
[im 146/244  mediastinal]
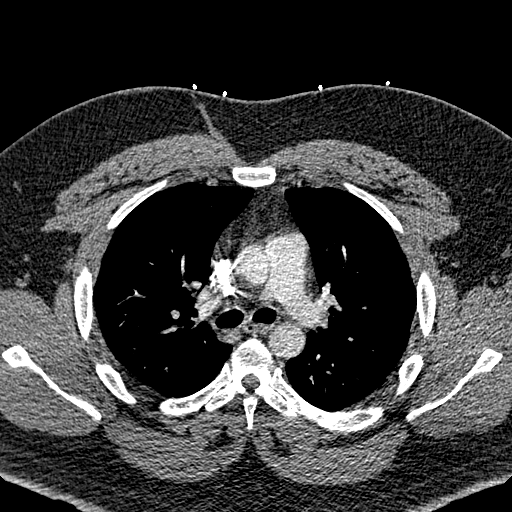
[im 158/244  lung]
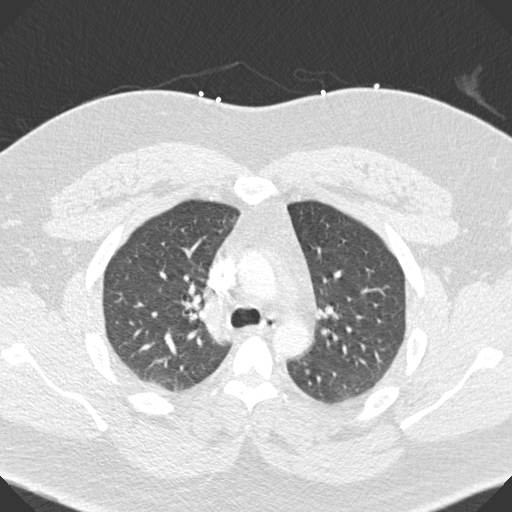
[im 163/244  mediastinal]
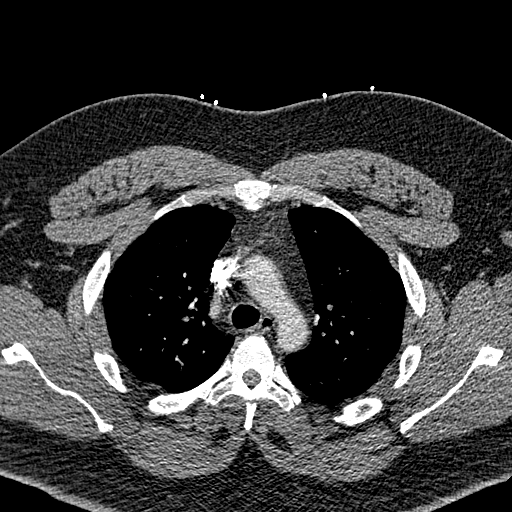
[im 171/244  lung]
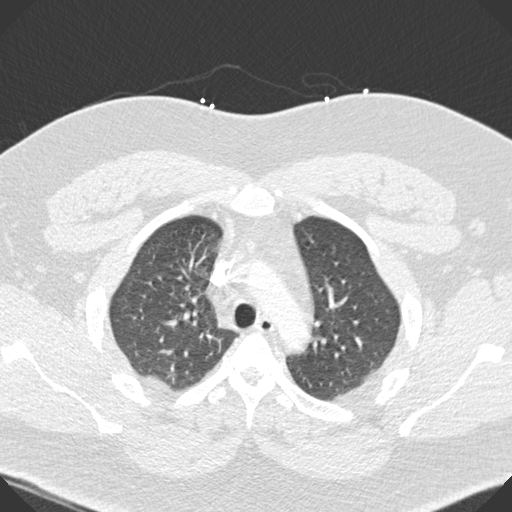
[im 183/244  mediastinal]
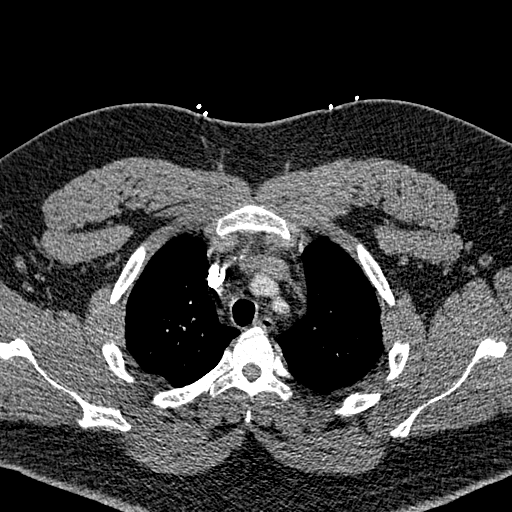
[im 207/244  lung]
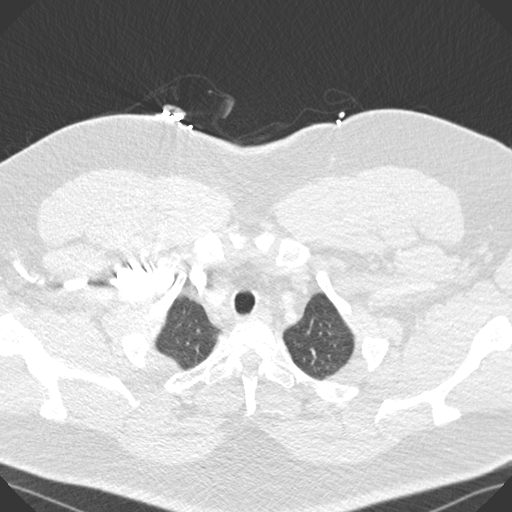
[im 219/244  mediastinal]
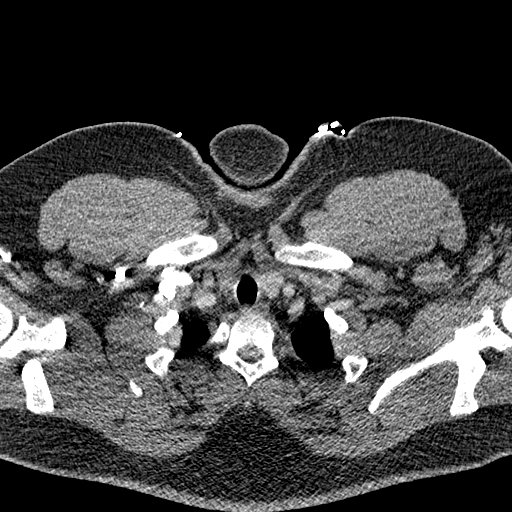
[im 231/244  lung]
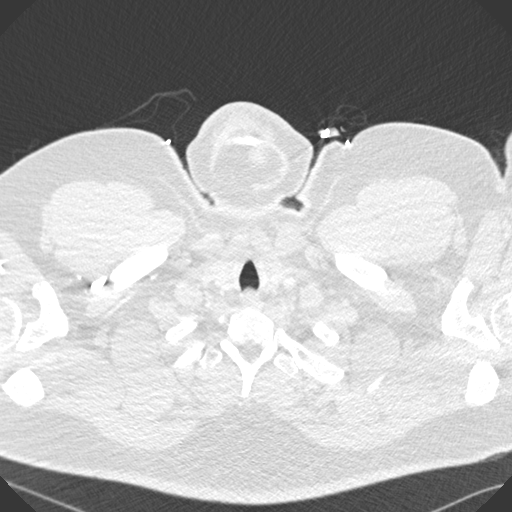

[19 of 32 positions shown; findings below may reference images not displayed]

FINDINGS: There is no demonstrable pulmonary embolus. There is no thoracic
aortic aneurysm or dissection.

There is mild dependent type atelectasis in the lung bases. There is
no lung edema or consolidation. There is no appreciable thoracic
adenopathy. The pericardium is not thickened.

Visualized upper abdominal structures appear normal. Thyroid appears
unremarkable. There are no blastic or lytic bone lesions.

Review of the MIP images confirms the above findings.
IMPRESSION: No demonstrable pulmonary embolus. Mild bibasilar atelectatic type
change. No edema or consolidation.

## 2017-01-18 IMAGING — US US RENAL
1 series · 14 of 17 positions shown · non-contrast
Comparison: None.

CLINICAL DATA: Increased creatinine.

EXAM:
RENAL/URINARY TRACT ULTRASOUND COMPLETE

[Series 1: us renal · 0.23mm/px · 14 of 17 slices shown]
[im 1/17]
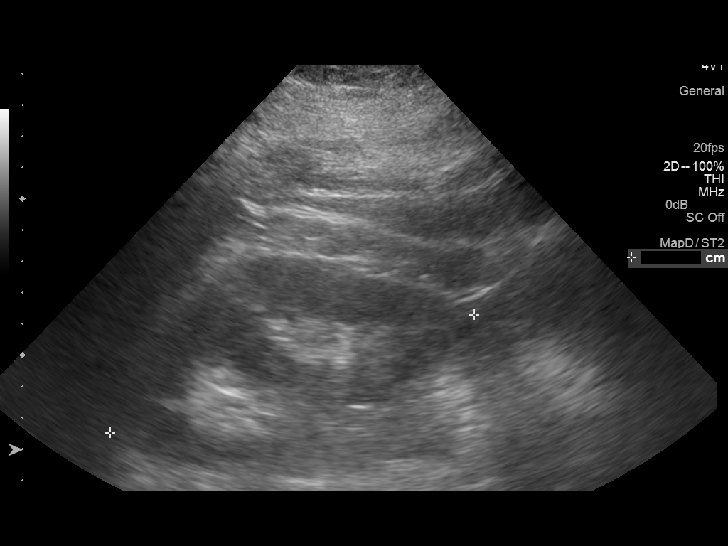
[im 2/17]
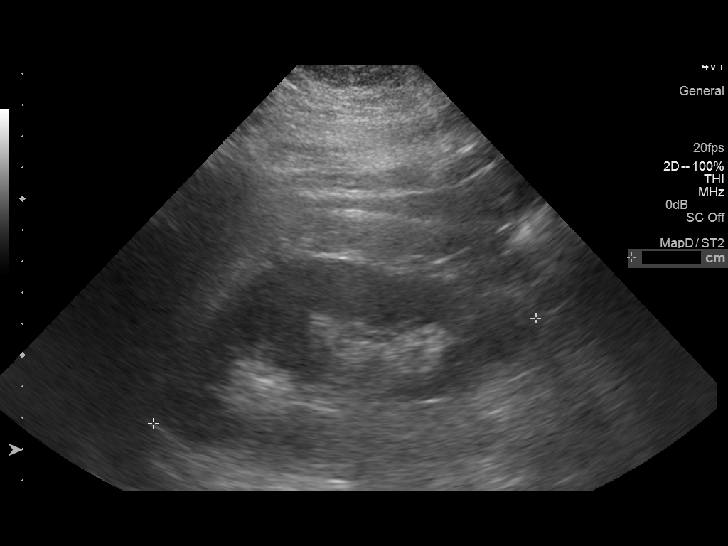
[im 4/17]
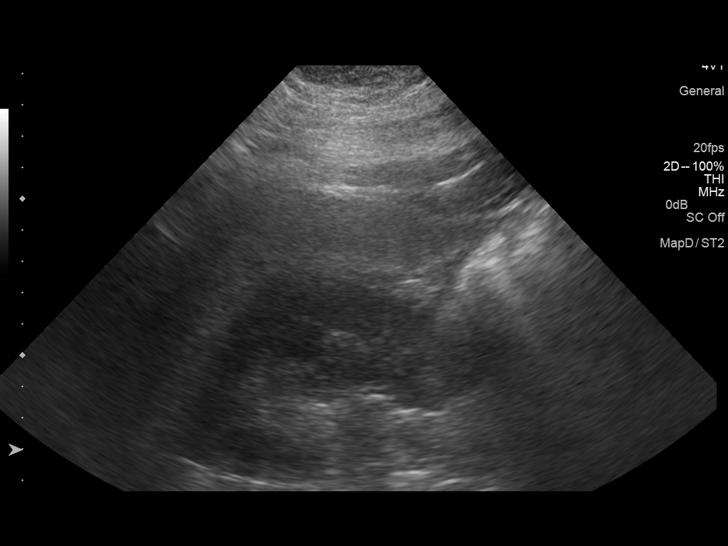
[im 5/17]
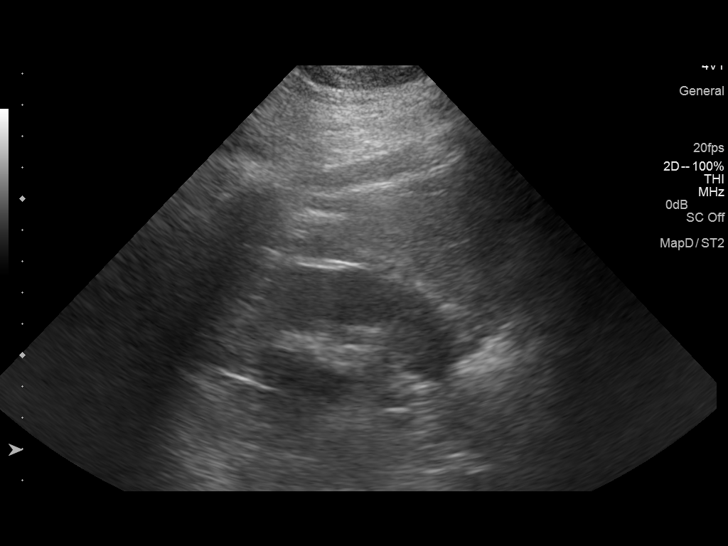
[im 6/17]
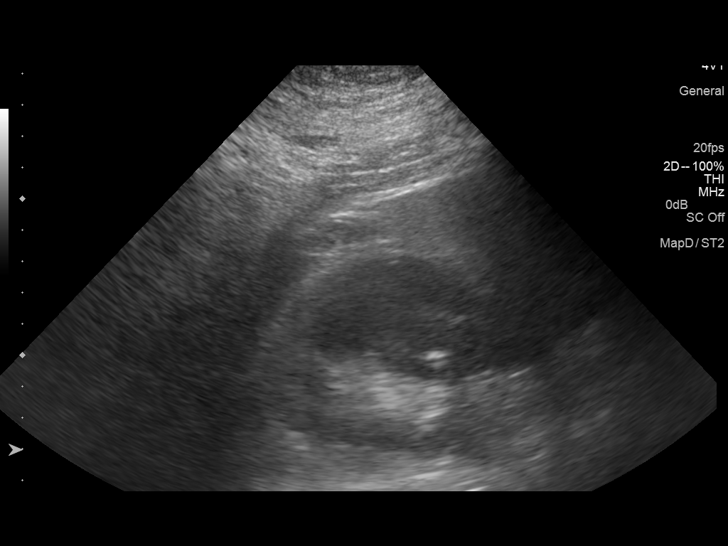
[im 7/17]
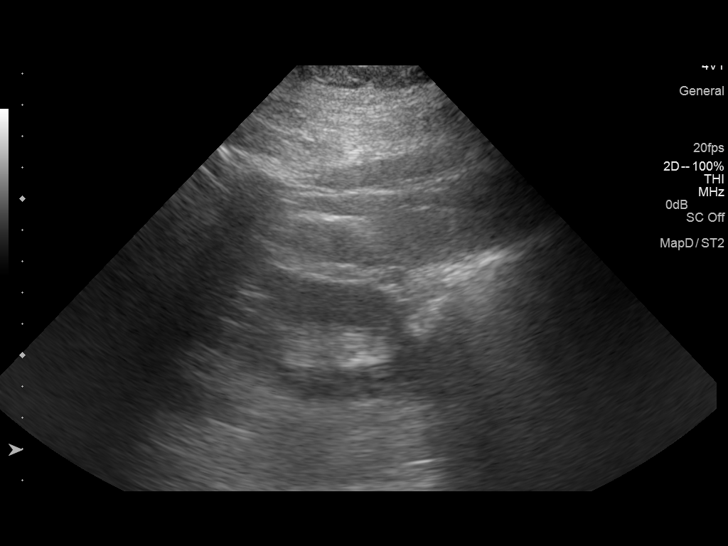
[im 8/17]
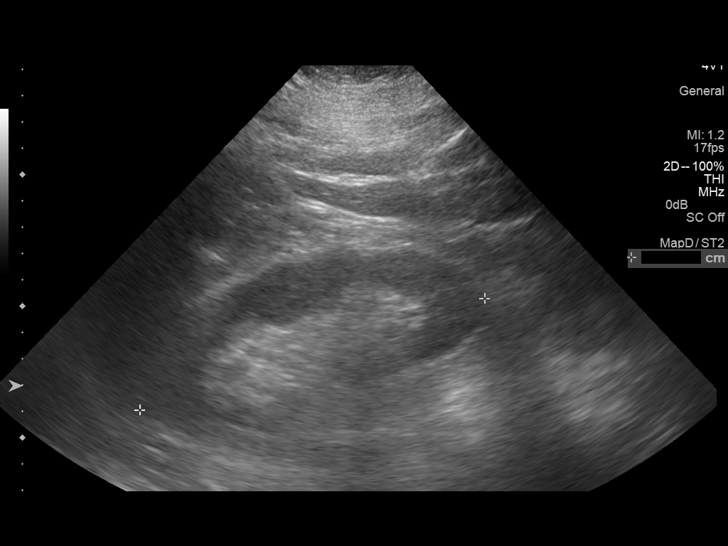
[im 10/17]
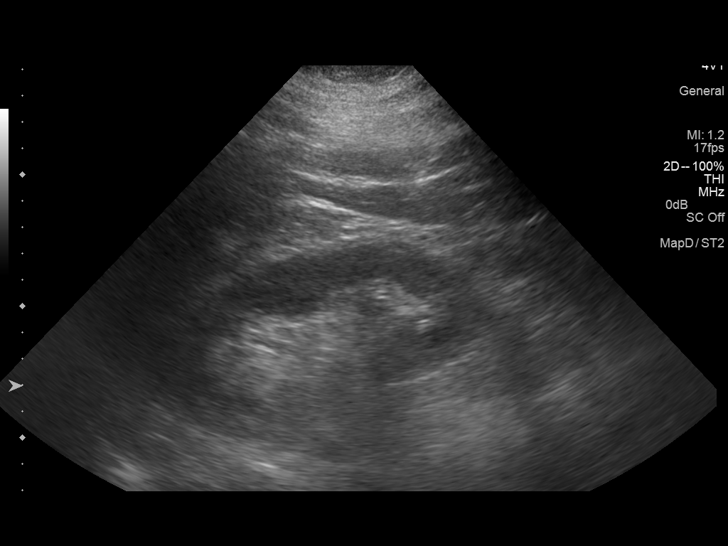
[im 11/17]
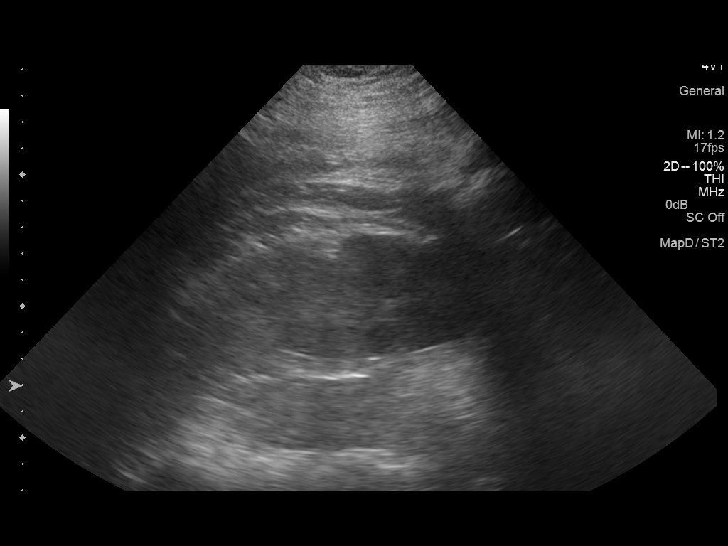
[im 12/17]
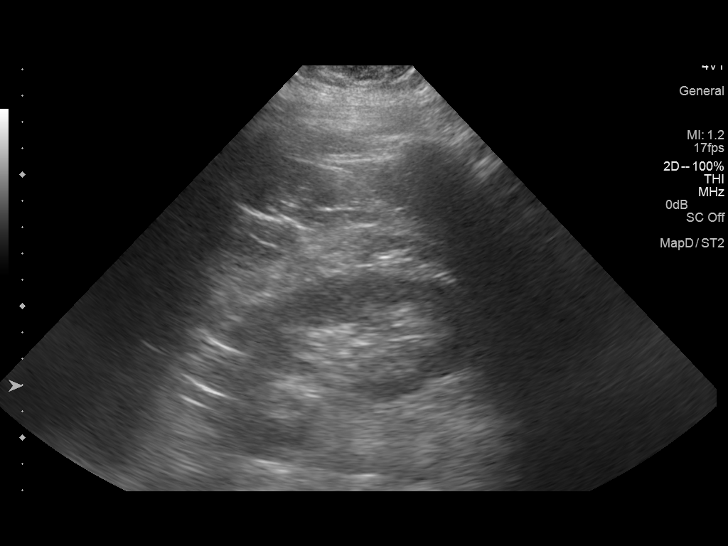
[im 13/17]
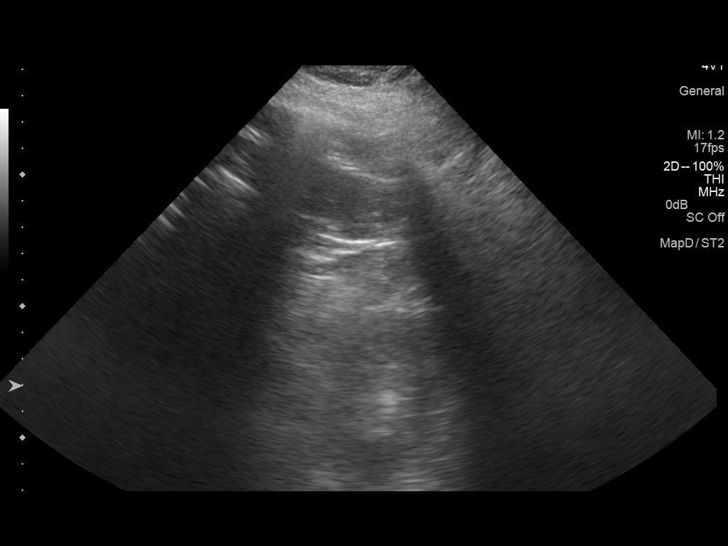
[im 14/17]
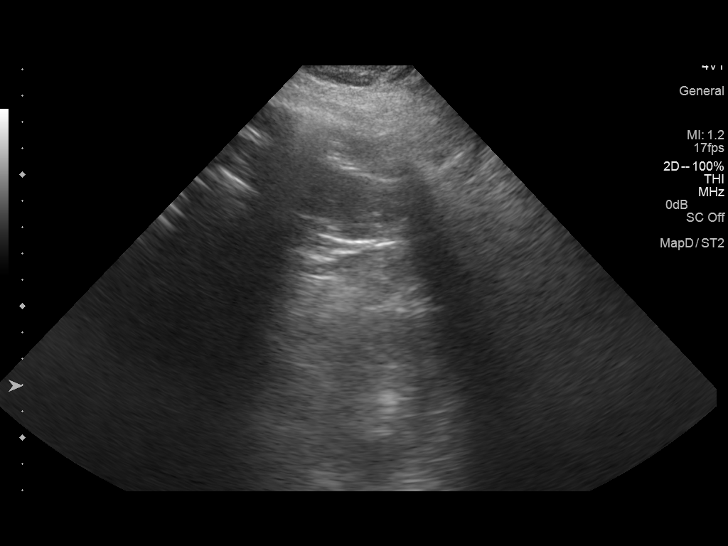
[im 16/17]
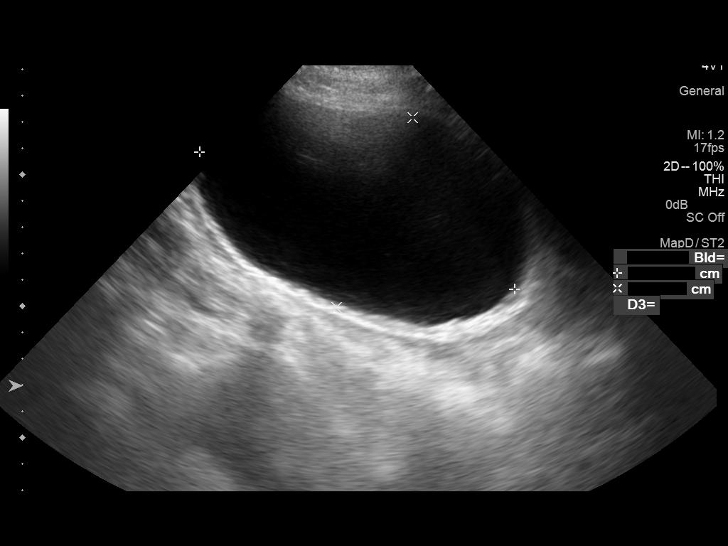
[im 17/17]
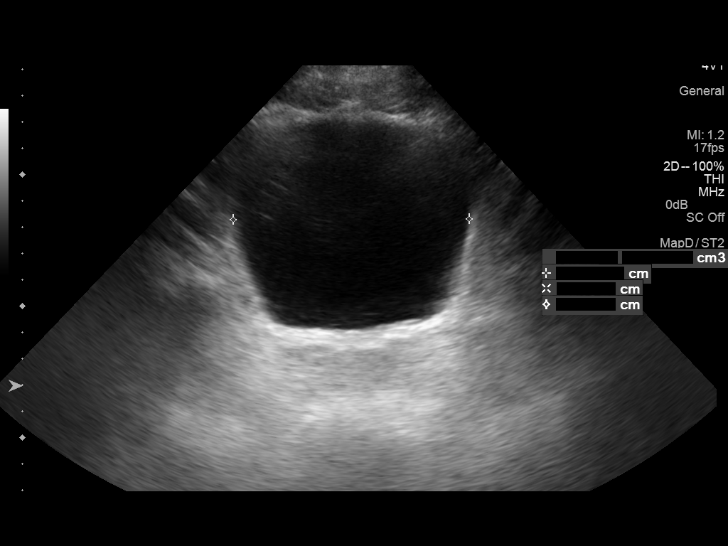

[14 of 17 positions shown; findings below may reference images not displayed]

FINDINGS: Right Kidney:

Length: 12.7 cm. Echogenicity within normal limits. No mass or
hydronephrosis visualized.

Left Kidney:

Length: 13.8 cm. Echogenicity within normal limits. No mass or
hydronephrosis visualized.

Bladder:

Appears normal for degree of bladder distention.
IMPRESSION: Normal exam.
